# Patient Record
Sex: Male | Born: 1955 | Race: Black or African American | Hispanic: No | Marital: Married | Smoking: Former smoker
Health system: Southern US, Community
[De-identification: ages and names within clinical notes are randomized; demographics above are authoritative.]

## PROBLEM LIST (undated history)

## (undated) DIAGNOSIS — H209 Unspecified iridocyclitis: Secondary | ICD-10-CM

## (undated) DIAGNOSIS — I10 Essential (primary) hypertension: Secondary | ICD-10-CM

## (undated) DIAGNOSIS — R569 Unspecified convulsions: Secondary | ICD-10-CM

## (undated) DIAGNOSIS — E119 Type 2 diabetes mellitus without complications: Secondary | ICD-10-CM

## (undated) HISTORY — DX: Type 2 diabetes mellitus without complications: E11.9

---

## 2004-03-17 ENCOUNTER — Ambulatory Visit: Payer: Self-pay | Admitting: Psychiatry

## 2004-03-17 ENCOUNTER — Other Ambulatory Visit (HOSPITAL_COMMUNITY): Admission: RE | Admit: 2004-03-17 | Discharge: 2004-06-15 | Payer: Self-pay | Admitting: Psychiatry

## 2004-06-29 ENCOUNTER — Emergency Department: Payer: Self-pay | Admitting: Emergency Medicine

## 2005-09-20 ENCOUNTER — Emergency Department: Payer: Self-pay | Admitting: Internal Medicine

## 2009-09-03 ENCOUNTER — Emergency Department: Payer: Self-pay | Admitting: Emergency Medicine

## 2010-03-29 ENCOUNTER — Inpatient Hospital Stay: Payer: Self-pay | Admitting: Internal Medicine

## 2010-03-29 DIAGNOSIS — I059 Rheumatic mitral valve disease, unspecified: Secondary | ICD-10-CM

## 2010-07-10 ENCOUNTER — Inpatient Hospital Stay: Payer: Self-pay | Admitting: Specialist

## 2010-11-03 ENCOUNTER — Emergency Department: Payer: Self-pay | Admitting: Emergency Medicine

## 2011-03-12 ENCOUNTER — Emergency Department: Payer: Self-pay | Admitting: Emergency Medicine

## 2011-03-12 LAB — RAPID INFLUENZA A&B ANTIGENS

## 2011-04-09 ENCOUNTER — Emergency Department: Payer: Self-pay | Admitting: Emergency Medicine

## 2011-04-09 LAB — COMPREHENSIVE METABOLIC PANEL
Albumin: 3.8 g/dL (ref 3.4–5.0)
Alkaline Phosphatase: 60 U/L (ref 50–136)
BUN: 15 mg/dL (ref 7–18)
Calcium, Total: 8.7 mg/dL (ref 8.5–10.1)
Glucose: 162 mg/dL — ABNORMAL HIGH (ref 65–99)
Osmolality: 293 (ref 275–301)
Potassium: 3.8 mmol/L (ref 3.5–5.1)
SGOT(AST): 32 U/L (ref 15–37)
SGPT (ALT): 27 U/L
Sodium: 145 mmol/L (ref 136–145)
Total Protein: 7.7 g/dL (ref 6.4–8.2)

## 2011-04-09 LAB — CBC WITH DIFFERENTIAL/PLATELET
Basophil #: 0 10*3/uL (ref 0.0–0.1)
Eosinophil %: 0.2 %
Lymphocyte %: 19.8 %
MCH: 27.5 pg (ref 26.0–34.0)
MCHC: 33 g/dL (ref 32.0–36.0)
Neutrophil #: 5.8 10*3/uL (ref 1.4–6.5)
Neutrophil %: 74.1 %
Platelet: 229 10*3/uL (ref 150–440)
RBC: 5.18 10*6/uL (ref 4.40–5.90)
RDW: 14.9 % — ABNORMAL HIGH (ref 11.5–14.5)

## 2011-04-09 LAB — ETHANOL: Ethanol %: <0.003 % (ref 0.000–0.080)

## 2011-04-10 LAB — URINALYSIS, COMPLETE
Bilirubin,UR: NEGATIVE
Glucose,UR: 150 mg/dL (ref 0–75)
Ketone: NEGATIVE
Nitrite: NEGATIVE
Ph: 7 (ref 4.5–8.0)
RBC,UR: <1 /HPF (ref 0–5)
Squamous Epithelial: NONE SEEN

## 2011-04-10 LAB — DRUG SCREEN, URINE
Amphetamines, Ur Screen: NEGATIVE (ref ?–1000)
Cannabinoid 50 Ng, Ur ~~LOC~~: POSITIVE (ref ?–50)
MDMA (Ecstasy)Ur Screen: NEGATIVE (ref ?–500)
Methadone, Ur Screen: NEGATIVE (ref ?–300)
Opiate, Ur Screen: NEGATIVE (ref ?–300)
Phencyclidine (PCP) Ur S: NEGATIVE (ref ?–25)

## 2011-05-18 ENCOUNTER — Emergency Department: Payer: Self-pay | Admitting: *Deleted

## 2011-05-18 LAB — DRUG SCREEN, URINE
Amphetamines, Ur Screen: NEGATIVE (ref ?–1000)
Barbiturates, Ur Screen: NEGATIVE (ref ?–200)
Benzodiazepine, Ur Scrn: NEGATIVE (ref ?–200)
MDMA (Ecstasy)Ur Screen: NEGATIVE (ref ?–500)
Opiate, Ur Screen: NEGATIVE (ref ?–300)
Phencyclidine (PCP) Ur S: NEGATIVE (ref ?–25)
Tricyclic, Ur Screen: NEGATIVE (ref ?–1000)

## 2011-05-18 LAB — COMPREHENSIVE METABOLIC PANEL
Albumin: 4.1 g/dL (ref 3.4–5.0)
Alkaline Phosphatase: 77 U/L (ref 50–136)
Anion Gap: 12 (ref 7–16)
BUN: 12 mg/dL (ref 7–18)
Chloride: 107 mmol/L (ref 98–107)
Co2: 22 mmol/L (ref 21–32)
EGFR (African American): >60
EGFR (Non-African Amer.): >60
Glucose: 94 mg/dL (ref 65–99)
Potassium: 4.3 mmol/L (ref 3.5–5.1)
SGOT(AST): 30 U/L (ref 15–37)
SGPT (ALT): 22 U/L
Sodium: 141 mmol/L (ref 136–145)
Total Protein: 8.5 g/dL — ABNORMAL HIGH (ref 6.4–8.2)

## 2011-05-18 LAB — ACETAMINOPHEN LEVEL: Acetaminophen: <2 ug/mL

## 2011-05-18 LAB — ETHANOL
Ethanol %: <0.003 % (ref 0.000–0.080)
Ethanol: <3 mg/dL

## 2011-05-18 LAB — CBC
MCH: 27.1 pg (ref 26.0–34.0)
MCV: 83 fL (ref 80–100)
Platelet: 233 10*3/uL (ref 150–440)
RBC: 5.61 10*6/uL (ref 4.40–5.90)
RDW: 15.4 % — ABNORMAL HIGH (ref 11.5–14.5)
WBC: 5.2 10*3/uL (ref 3.8–10.6)

## 2011-05-18 LAB — SALICYLATE LEVEL: Salicylates, Serum: <1.7 mg/dL

## 2011-07-04 ENCOUNTER — Emergency Department: Payer: Self-pay | Admitting: *Deleted

## 2011-08-03 ENCOUNTER — Emergency Department: Payer: Self-pay | Admitting: *Deleted

## 2011-08-03 LAB — BASIC METABOLIC PANEL
BUN: 12 mg/dL (ref 7–18)
Calcium, Total: 8.8 mg/dL (ref 8.5–10.1)
Chloride: 110 mmol/L — ABNORMAL HIGH (ref 98–107)
Co2: 27 mmol/L (ref 21–32)
Creatinine: 1.21 mg/dL (ref 0.60–1.30)
EGFR (African American): >60
EGFR (Non-African Amer.): >60
Glucose: 114 mg/dL — ABNORMAL HIGH (ref 65–99)
Osmolality: 284 (ref 275–301)
Potassium: 3.8 mmol/L (ref 3.5–5.1)
Sodium: 142 mmol/L (ref 136–145)

## 2011-08-03 LAB — CBC
HGB: 13.7 g/dL (ref 13.0–18.0)
MCHC: 32.6 g/dL (ref 32.0–36.0)
MCV: 82 fL (ref 80–100)
Platelet: 222 10*3/uL (ref 150–440)
RBC: 5.13 10*6/uL (ref 4.40–5.90)
WBC: 3.3 10*3/uL — ABNORMAL LOW (ref 3.8–10.6)

## 2011-08-03 LAB — CK TOTAL AND CKMB (NOT AT ARMC)
CK, Total: 315 U/L — ABNORMAL HIGH (ref 35–232)
CK-MB: 3.4 ng/mL (ref 0.5–3.6)

## 2011-08-03 LAB — TROPONIN I: Troponin-I: <0.02 ng/mL

## 2011-11-18 ENCOUNTER — Emergency Department: Payer: Self-pay | Admitting: Unknown Physician Specialty

## 2011-11-18 LAB — COMPREHENSIVE METABOLIC PANEL
Alkaline Phosphatase: 90 U/L (ref 50–136)
Anion Gap: 7 (ref 7–16)
BUN: 14 mg/dL (ref 7–18)
Bilirubin,Total: 0.4 mg/dL (ref 0.2–1.0)
Calcium, Total: 9.1 mg/dL (ref 8.5–10.1)
Chloride: 109 mmol/L — ABNORMAL HIGH (ref 98–107)
Co2: 28 mmol/L (ref 21–32)
Creatinine: 1.53 mg/dL — ABNORMAL HIGH (ref 0.60–1.30)
EGFR (Non-African Amer.): 51 — ABNORMAL LOW
Osmolality: 287 (ref 275–301)
Potassium: 4.2 mmol/L (ref 3.5–5.1)
SGPT (ALT): 24 U/L (ref 12–78)
Sodium: 144 mmol/L (ref 136–145)
Total Protein: 7.6 g/dL (ref 6.4–8.2)

## 2011-11-18 LAB — DRUG SCREEN, URINE
Barbiturates, Ur Screen: NEGATIVE (ref ?–200)
Cannabinoid 50 Ng, Ur ~~LOC~~: NEGATIVE (ref ?–50)
Cocaine Metabolite,Ur ~~LOC~~: POSITIVE (ref ?–300)
MDMA (Ecstasy)Ur Screen: NEGATIVE (ref ?–500)
Methadone, Ur Screen: NEGATIVE (ref ?–300)
Opiate, Ur Screen: NEGATIVE (ref ?–300)
Phencyclidine (PCP) Ur S: NEGATIVE (ref ?–25)

## 2011-11-18 LAB — CBC WITH DIFFERENTIAL/PLATELET
Basophil %: 0.8 %
Eosinophil #: 0 10*3/uL (ref 0.0–0.7)
HCT: 45.9 % (ref 40.0–52.0)
HGB: 15.4 g/dL (ref 13.0–18.0)
Lymphocyte #: 1 10*3/uL (ref 1.0–3.6)
Lymphocyte %: 22.3 %
MCH: 27.5 pg (ref 26.0–34.0)
MCHC: 33.5 g/dL (ref 32.0–36.0)
MCV: 82 fL (ref 80–100)
Monocyte #: 0.2 x10 3/mm (ref 0.2–1.0)
Neutrophil #: 3.1 10*3/uL (ref 1.4–6.5)

## 2011-11-18 LAB — URINALYSIS, COMPLETE
Bacteria: NONE SEEN
Bilirubin,UR: NEGATIVE
Glucose,UR: NEGATIVE mg/dL (ref 0–75)
Ketone: NEGATIVE
Nitrite: NEGATIVE
Protein: NEGATIVE
RBC,UR: <1 /HPF (ref 0–5)
Specific Gravity: 1.017 (ref 1.003–1.030)
Squamous Epithelial: NONE SEEN
WBC UR: <1 /HPF (ref 0–5)

## 2011-11-18 LAB — ETHANOL
Ethanol %: <0.003 % (ref 0.000–0.080)
Ethanol: <3 mg/dL

## 2011-12-14 DIAGNOSIS — I10 Essential (primary) hypertension: Secondary | ICD-10-CM | POA: Diagnosis present

## 2011-12-14 DIAGNOSIS — E119 Type 2 diabetes mellitus without complications: Secondary | ICD-10-CM

## 2011-12-14 DIAGNOSIS — R569 Unspecified convulsions: Secondary | ICD-10-CM

## 2011-12-18 ENCOUNTER — Emergency Department: Payer: Self-pay | Admitting: Emergency Medicine

## 2011-12-18 LAB — CBC WITH DIFFERENTIAL/PLATELET
Basophil #: 0 10*3/uL (ref 0.0–0.1)
Basophil %: 0.5 %
Eosinophil #: 0 10*3/uL (ref 0.0–0.7)
Eosinophil %: 0.5 %
HGB: 14.3 g/dL (ref 13.0–18.0)
Lymphocyte %: 57.6 %
MCHC: 33.6 g/dL (ref 32.0–36.0)
Neutrophil %: 33.1 %
RBC: 5.19 10*6/uL (ref 4.40–5.90)
RDW: 14.1 % (ref 11.5–14.5)

## 2011-12-18 LAB — DRUG SCREEN, URINE
Amphetamines, Ur Screen: NEGATIVE (ref ?–1000)
Barbiturates, Ur Screen: NEGATIVE (ref ?–200)
Benzodiazepine, Ur Scrn: NEGATIVE (ref ?–200)
Cannabinoid 50 Ng, Ur ~~LOC~~: NEGATIVE (ref ?–50)
Cocaine Metabolite,Ur ~~LOC~~: POSITIVE (ref ?–300)
MDMA (Ecstasy)Ur Screen: NEGATIVE (ref ?–500)
Phencyclidine (PCP) Ur S: NEGATIVE (ref ?–25)

## 2011-12-18 LAB — URINALYSIS, COMPLETE
Bacteria: NONE SEEN
Bilirubin,UR: NEGATIVE
Glucose,UR: NEGATIVE mg/dL (ref 0–75)
Leukocyte Esterase: NEGATIVE
Nitrite: NEGATIVE
Ph: 6 (ref 4.5–8.0)
Protein: NEGATIVE
Specific Gravity: 1.014 (ref 1.003–1.030)
Squamous Epithelial: NONE SEEN
WBC UR: 1 /HPF (ref 0–5)

## 2011-12-18 LAB — BASIC METABOLIC PANEL
BUN: 18 mg/dL (ref 7–18)
Chloride: 108 mmol/L — ABNORMAL HIGH (ref 98–107)
Creatinine: 1.45 mg/dL — ABNORMAL HIGH (ref 0.60–1.30)
EGFR (African American): >60
EGFR (Non-African Amer.): 54 — ABNORMAL LOW
Glucose: 94 mg/dL (ref 65–99)
Potassium: 3.8 mmol/L (ref 3.5–5.1)
Sodium: 142 mmol/L (ref 136–145)

## 2011-12-19 ENCOUNTER — Emergency Department: Payer: Self-pay | Admitting: Emergency Medicine

## 2011-12-19 LAB — CBC WITH DIFFERENTIAL/PLATELET
Basophil #: 0 10*3/uL (ref 0.0–0.1)
Eosinophil #: 0 10*3/uL (ref 0.0–0.7)
Eosinophil %: 0.3 %
Lymphocyte #: 1.8 10*3/uL (ref 1.0–3.6)
MCH: 27.1 pg (ref 26.0–34.0)
MCHC: 33.2 g/dL (ref 32.0–36.0)
MCV: 82 fL (ref 80–100)
Neutrophil %: 51.2 %
Platelet: 253 10*3/uL (ref 150–440)
RDW: 14.3 % (ref 11.5–14.5)
WBC: 4.8 10*3/uL (ref 3.8–10.6)

## 2011-12-19 LAB — COMPREHENSIVE METABOLIC PANEL
Albumin: 3.8 g/dL (ref 3.4–5.0)
Chloride: 109 mmol/L — ABNORMAL HIGH (ref 98–107)
Creatinine: 1.52 mg/dL — ABNORMAL HIGH (ref 0.60–1.30)
EGFR (African American): 59 — ABNORMAL LOW
EGFR (Non-African Amer.): 51 — ABNORMAL LOW
Potassium: 3.7 mmol/L (ref 3.5–5.1)
SGOT(AST): 34 U/L (ref 15–37)
SGPT (ALT): 30 U/L (ref 12–78)
Total Protein: 7.8 g/dL (ref 6.4–8.2)

## 2011-12-19 LAB — ETHANOL: Ethanol: <3 mg/dL

## 2012-04-23 ENCOUNTER — Emergency Department: Payer: Self-pay | Admitting: Internal Medicine

## 2012-04-23 LAB — DRUG SCREEN, URINE
Amphetamines, Ur Screen: NEGATIVE (ref ?–1000)
Benzodiazepine, Ur Scrn: NEGATIVE (ref ?–200)
Cocaine Metabolite,Ur ~~LOC~~: POSITIVE (ref ?–300)
MDMA (Ecstasy)Ur Screen: NEGATIVE (ref ?–500)
Methadone, Ur Screen: NEGATIVE (ref ?–300)
Opiate, Ur Screen: NEGATIVE (ref ?–300)

## 2012-04-23 LAB — URINALYSIS, COMPLETE
Bacteria: NONE SEEN
Ketone: NEGATIVE
Nitrite: NEGATIVE
RBC,UR: 1 /HPF (ref 0–5)
WBC UR: <1 /HPF (ref 0–5)

## 2012-04-23 LAB — CBC
HCT: 41.4 % (ref 40.0–52.0)
MCH: 27.3 pg (ref 26.0–34.0)
RDW: 15.1 % — ABNORMAL HIGH (ref 11.5–14.5)

## 2012-04-23 LAB — COMPREHENSIVE METABOLIC PANEL
Bilirubin,Total: 0.6 mg/dL (ref 0.2–1.0)
Co2: 28 mmol/L (ref 21–32)
EGFR (Non-African Amer.): >60
Glucose: 100 mg/dL — ABNORMAL HIGH (ref 65–99)
SGOT(AST): 25 U/L (ref 15–37)
SGPT (ALT): 22 U/L (ref 12–78)
Total Protein: 7.7 g/dL (ref 6.4–8.2)

## 2012-11-15 ENCOUNTER — Inpatient Hospital Stay: Payer: Self-pay | Admitting: Student

## 2012-11-15 LAB — DRUG SCREEN, URINE
Amphetamines, Ur Screen: NEGATIVE (ref ?–1000)
Barbiturates, Ur Screen: NEGATIVE (ref ?–200)
Benzodiazepine, Ur Scrn: NEGATIVE (ref ?–200)
Cocaine Metabolite,Ur ~~LOC~~: POSITIVE (ref ?–300)
MDMA (Ecstasy)Ur Screen: NEGATIVE (ref ?–500)
Methadone, Ur Screen: NEGATIVE (ref ?–300)
Opiate, Ur Screen: NEGATIVE (ref ?–300)
Phencyclidine (PCP) Ur S: NEGATIVE (ref ?–25)
Tricyclic, Ur Screen: NEGATIVE (ref ?–1000)

## 2012-11-15 LAB — URINALYSIS, COMPLETE
Bacteria: NONE SEEN
Bilirubin,UR: NEGATIVE
Blood: NEGATIVE
Glucose,UR: NEGATIVE mg/dL (ref 0–75)
Nitrite: NEGATIVE
RBC,UR: NONE SEEN /HPF (ref 0–5)
Specific Gravity: 1.021 (ref 1.003–1.030)
Squamous Epithelial: NONE SEEN
WBC UR: 2 /HPF (ref 0–5)

## 2012-11-15 LAB — HEPATIC FUNCTION PANEL A (ARMC)
Albumin: 4.2 g/dL (ref 3.4–5.0)
Bilirubin, Direct: 0.2 mg/dL (ref 0.00–0.20)
Bilirubin,Total: 1 mg/dL (ref 0.2–1.0)
SGOT(AST): 37 U/L (ref 15–37)
SGPT (ALT): 26 U/L (ref 12–78)

## 2012-11-15 LAB — CBC WITH DIFFERENTIAL/PLATELET
Basophil #: 0 10*3/uL (ref 0.0–0.1)
Eosinophil %: 0 %
HGB: 16.6 g/dL (ref 13.0–18.0)
Lymphocyte #: 1 10*3/uL (ref 1.0–3.6)
Lymphocyte %: 12.8 %
MCH: 27.2 pg (ref 26.0–34.0)
MCV: 81 fL (ref 80–100)
Neutrophil #: 6.5 10*3/uL (ref 1.4–6.5)
Neutrophil %: 81.8 %
Platelet: 251 10*3/uL (ref 150–440)
RBC: 6.11 10*6/uL — ABNORMAL HIGH (ref 4.40–5.90)

## 2012-11-15 LAB — BASIC METABOLIC PANEL
Anion Gap: 5 — ABNORMAL LOW (ref 7–16)
BUN: 12 mg/dL (ref 7–18)
Calcium, Total: 9.6 mg/dL (ref 8.5–10.1)
Chloride: 104 mmol/L (ref 98–107)
Creatinine: 1.45 mg/dL — ABNORMAL HIGH (ref 0.60–1.30)
EGFR (African American): >60
EGFR (Non-African Amer.): 54 — ABNORMAL LOW
Glucose: 109 mg/dL — ABNORMAL HIGH (ref 65–99)
Sodium: 137 mmol/L (ref 136–145)

## 2012-11-16 LAB — CBC WITH DIFFERENTIAL/PLATELET
Basophil #: 0 10*3/uL (ref 0.0–0.1)
Eosinophil #: 0 10*3/uL (ref 0.0–0.7)
Eosinophil %: 0.2 %
HCT: 46.3 % (ref 40.0–52.0)
HGB: 15.7 g/dL (ref 13.0–18.0)
Lymphocyte #: 2.9 10*3/uL (ref 1.0–3.6)
MCH: 27.3 pg (ref 26.0–34.0)
Monocyte %: 8.7 %
Neutrophil %: 41.2 %
Platelet: 237 10*3/uL (ref 150–440)
RBC: 5.74 10*6/uL (ref 4.40–5.90)
RDW: 14.3 % (ref 11.5–14.5)
WBC: 5.9 10*3/uL (ref 3.8–10.6)

## 2012-11-16 LAB — MAGNESIUM: Magnesium: 2 mg/dL

## 2012-11-16 LAB — LIPID PANEL
Cholesterol: 172 mg/dL (ref 0–200)
HDL Cholesterol: 40 mg/dL (ref 40–60)
Ldl Cholesterol, Calc: 113 mg/dL — ABNORMAL HIGH (ref 0–100)
Triglycerides: 97 mg/dL (ref 0–200)
VLDL Cholesterol, Calc: 19 mg/dL (ref 5–40)

## 2012-11-16 LAB — BASIC METABOLIC PANEL
BUN: 13 mg/dL (ref 7–18)
Co2: 26 mmol/L (ref 21–32)
Glucose: 85 mg/dL (ref 65–99)
Osmolality: 277 (ref 275–301)
Potassium: 3.4 mmol/L — ABNORMAL LOW (ref 3.5–5.1)
Sodium: 139 mmol/L (ref 136–145)

## 2012-11-16 LAB — HEMOGLOBIN A1C: Hemoglobin A1C: 6.2 % (ref 4.2–6.3)

## 2012-11-17 LAB — BASIC METABOLIC PANEL
Anion Gap: 8 (ref 7–16)
Calcium, Total: 9.1 mg/dL (ref 8.5–10.1)
Co2: 24 mmol/L (ref 21–32)
EGFR (African American): >60
Osmolality: 279 (ref 275–301)

## 2012-11-17 LAB — HEMOGLOBIN A1C: Hemoglobin A1C: 6.2 % (ref 4.2–6.3)

## 2013-02-03 ENCOUNTER — Emergency Department: Payer: Self-pay | Admitting: Emergency Medicine

## 2013-02-03 LAB — RAPID INFLUENZA A&B ANTIGENS

## 2013-02-03 LAB — URINALYSIS, COMPLETE
Bilirubin,UR: NEGATIVE
Blood: NEGATIVE
Glucose,UR: NEGATIVE mg/dL (ref 0–75)
Leukocyte Esterase: NEGATIVE
Nitrite: NEGATIVE
Protein: 100
Squamous Epithelial: NONE SEEN
WBC UR: 3 /HPF (ref 0–5)

## 2013-02-03 LAB — DRUG SCREEN, URINE
Barbiturates, Ur Screen: NEGATIVE (ref ?–200)
Benzodiazepine, Ur Scrn: NEGATIVE (ref ?–200)
Cannabinoid 50 Ng, Ur ~~LOC~~: POSITIVE (ref ?–50)
Cocaine Metabolite,Ur ~~LOC~~: POSITIVE (ref ?–300)
MDMA (Ecstasy)Ur Screen: NEGATIVE (ref ?–500)
Tricyclic, Ur Screen: NEGATIVE (ref ?–1000)

## 2013-02-03 LAB — CBC
HCT: 46.6 % (ref 40.0–52.0)
HGB: 15.2 g/dL (ref 13.0–18.0)
MCHC: 32.5 g/dL (ref 32.0–36.0)
Platelet: 256 10*3/uL (ref 150–440)
RBC: 5.73 10*6/uL (ref 4.40–5.90)
RDW: 14.8 % — ABNORMAL HIGH (ref 11.5–14.5)
WBC: 7.5 10*3/uL (ref 3.8–10.6)

## 2013-02-03 LAB — BASIC METABOLIC PANEL
Anion Gap: 4 — ABNORMAL LOW (ref 7–16)
BUN: 15 mg/dL (ref 7–18)
Calcium, Total: 9.5 mg/dL (ref 8.5–10.1)
Chloride: 106 mmol/L (ref 98–107)
Co2: 27 mmol/L (ref 21–32)
EGFR (African American): >60
Osmolality: 275 (ref 275–301)

## 2013-02-03 LAB — ETHANOL
Ethanol %: <0.003 % (ref 0.000–0.080)
Ethanol: <3 mg/dL

## 2013-03-14 ENCOUNTER — Encounter: Payer: Self-pay | Admitting: Interventional Cardiology

## 2013-04-10 ENCOUNTER — Emergency Department: Payer: Self-pay | Admitting: Emergency Medicine

## 2013-05-03 ENCOUNTER — Other Ambulatory Visit (HOSPITAL_COMMUNITY): Payer: No Typology Code available for payment source | Attending: Psychiatry | Admitting: Psychology

## 2013-05-03 DIAGNOSIS — F1220 Cannabis dependence, uncomplicated: Secondary | ICD-10-CM | POA: Insufficient documentation

## 2013-05-03 DIAGNOSIS — G40909 Epilepsy, unspecified, not intractable, without status epilepticus: Secondary | ICD-10-CM | POA: Insufficient documentation

## 2013-05-03 DIAGNOSIS — F1420 Cocaine dependence, uncomplicated: Secondary | ICD-10-CM | POA: Insufficient documentation

## 2013-05-03 DIAGNOSIS — I10 Essential (primary) hypertension: Secondary | ICD-10-CM | POA: Insufficient documentation

## 2013-05-03 DIAGNOSIS — F4310 Post-traumatic stress disorder, unspecified: Secondary | ICD-10-CM | POA: Insufficient documentation

## 2013-05-03 DIAGNOSIS — F1021 Alcohol dependence, in remission: Secondary | ICD-10-CM | POA: Insufficient documentation

## 2013-05-04 NOTE — Progress Notes (Unsigned)
    Daily Group Progress Note  Program: CD-IOP   Group Time: 1:00-2:30pm  Participation Level: Active  Behavioral Response: Appropriate  Type of Therapy: Process Group  Topic: Group members checked in by sharing their names and sobriety dates.  Counselor led group in 5 minutes of HeartMath breathing in order to promote relaxation and focus.  Group members then discussed how they were doing and shared about issues related to early recovery, including managing volatile emotions and PAWS symptoms, grief and loss, and making friends with others in recovery.  Three new group members were present, and each one shared about their reasons for seeking treatment in the program.       Group Time: 2:45-4:00pm  Participation Level: Active  Behavioral Response: Appropriate  Type of Therapy: Psycho-education Group  Topic: Counselor led a session on Distress Tolerance.  She led an exercise to demonstrate distress tolerance, asking group members hold their arms in front of them for about 2 minutes and then having them share how they coped with the discomfort.  She reviewed distraction strategies (represented by the acronym ACCEPTS) and asked group members to provide examples of these strategies.  She also explained the concept of self-soothing and had group members brainstorm self-soothing methods for each of the five senses.  Counselor emphasized that these strategies were especially helpful for managing cravings in early recovery.   Summary: Patient reported a sobriety date of 3/18. This was his first group session and he shared about himself with the group.  He shared that he had attained some sobriety in the past, and relapsed on crack.  He reported that he had not drank since February of last year, but was struggling to stop using crack and marijuana.  He shared that he has a seizure disorder, possibly related to drug use, and was trying to take his medication consistently.  He became tearful in  response to another group member sharing about grief, and noted that he had lost his mother while he was using.  He stated, "I want to change."  Patient showed willingness to be open with the group and connect with others.   Family Program: Family present? No   Name of family member(s): n/a  UDS collected: Yes Results: not yet available  AA/NA attended?: No  Sponsor?: No   [facility]

## 2013-05-05 ENCOUNTER — Encounter (HOSPITAL_COMMUNITY): Payer: Self-pay | Admitting: Psychology

## 2013-05-05 ENCOUNTER — Other Ambulatory Visit (HOSPITAL_COMMUNITY): Payer: No Typology Code available for payment source

## 2013-05-05 DIAGNOSIS — F1420 Cocaine dependence, uncomplicated: Secondary | ICD-10-CM

## 2013-05-05 DIAGNOSIS — F1220 Cannabis dependence, uncomplicated: Secondary | ICD-10-CM | POA: Insufficient documentation

## 2013-05-05 HISTORY — DX: Cocaine dependence, uncomplicated: F14.20

## 2013-05-05 NOTE — Progress Notes (Unsigned)
Patient ID: Carlos LeavensGeorge Gayheart, male   DOB: 1955-09-30, 58 y.o.   MRN: [id number]   PHONE CALL  PT CALLED AT HOME PHONE AND CONFIDENTIAL MESSAGE LEFT REGARDING REGRETS OVER CANCELLATION OF GROUP AND INVITATION TO CALL BACK IF HE WANTS/NEEDS TO

## 2013-05-08 ENCOUNTER — Other Ambulatory Visit (HOSPITAL_COMMUNITY): Payer: No Typology Code available for payment source | Admitting: Psychology

## 2013-05-08 DIAGNOSIS — F1420 Cocaine dependence, uncomplicated: Secondary | ICD-10-CM

## 2013-05-08 DIAGNOSIS — F1220 Cannabis dependence, uncomplicated: Secondary | ICD-10-CM

## 2013-05-09 ENCOUNTER — Encounter (HOSPITAL_COMMUNITY): Payer: Self-pay | Admitting: Psychology
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
## 2013-05-10 ENCOUNTER — Other Ambulatory Visit (HOSPITAL_COMMUNITY): Payer: No Typology Code available for payment source | Admitting: Psychology

## 2013-05-10 DIAGNOSIS — F1420 Cocaine dependence, uncomplicated: Secondary | ICD-10-CM

## 2013-05-10 DIAGNOSIS — F1220 Cannabis dependence, uncomplicated: Secondary | ICD-10-CM

## 2013-05-11 NOTE — Progress Notes (Unsigned)
    Daily Group Progress Note  Program: CD-IOP   Group Time: 1:00-2:30pm  Participation Level: Active  Behavioral Response: Appropriate  Type of Therapy: Process Group  Topic: The first half of group was spent in process.  Counselor opened the group with a check-in and then led 5 minutes of a relaxation breathing exercise including some calming music.  Counselor then led a review on the group expectations and beliefs, and group members discussed concerns and suggestions about these expectations. Much of the conversation centered around the therapeutic benefits of sharing feelings and attending to others while they share. Group members then shared about issues they experienced during early recovery, including dealing with family health issues, handling denial, and finding a recovery community that meets their needs.     Group Time: 2:45-4:00pm  Participation Level: Active  Behavioral Response: Appropriate  Type of Therapy: Psycho-education Group  Topic: Counselor led a discussion on the Serenity Prayer.   Group members spent time creating lists of things that they can and cannot change, and then shared their lists with the entire group.  Counselor emphasized that in recovery, addicts have the ability to change their own behaviors, attitudes, and outlook, and cannot change other people, the past, or unforeseen events.  Counselor asked clients to share one thing they could change that day in order to better support their recovery, and several group members shared ideas.   Summary: Patient reported a sobriety date of 3/21.  He shared that he had attended two NA meetings since the last group session, and had enjoyed the experience.  He reported that he got phone numbers from other people in recovery and was looking forward to attending more meetings.  During the process group, he offered encouragement and feedback to several other group members.  Patient appears to be more hopeful and more  invested in treatment compared to previous sessions.   Family Program: Family present? No   Name of family member(s): n/a  UDS collected: Yes Results: not yet available  AA/NA attended?: YesTuesday and Wednesday  Sponsor?: No   [facility]

## 2013-05-12 ENCOUNTER — Other Ambulatory Visit (HOSPITAL_COMMUNITY): Payer: No Typology Code available for payment source | Admitting: Psychology

## 2013-05-12 DIAGNOSIS — F1020 Alcohol dependence, uncomplicated: Secondary | ICD-10-CM | POA: Insufficient documentation

## 2013-05-12 HISTORY — DX: Alcohol dependence, uncomplicated: F10.20
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
## 2013-05-14 ENCOUNTER — Encounter (HOSPITAL_COMMUNITY): Payer: Self-pay

## 2013-05-15 ENCOUNTER — Other Ambulatory Visit (HOSPITAL_COMMUNITY): Payer: No Typology Code available for payment source

## 2013-05-15 ENCOUNTER — Encounter (HOSPITAL_COMMUNITY): Payer: Self-pay | Admitting: Psychology

## 2013-05-15 NOTE — Progress Notes (Signed)
    Daily Group Progress Note  Program: CD-IOP   Group Time: 1-2:30 pm  Participation Level: Minimal, he was meeting with the program director  Behavioral Response: Sharing  Type of Therapy: Process Group  Topic: Group Process; the first part of group was spent in process. After check-in, a member made a heartfelt and painful confession that he had lied to the group during the last session. He had relapsed, but been so embarrassed and ashamed that he couldn't admit it on Wednesday. The group provided positive and validating feedback and emphasized the good news, which was that he had "come back". A new member was also present and he introduced himself. He had also relapsed after 10+ years of sobriety and he received a warm welcome from his new group members. During this session the program director was meeting with new group members and current ones regarding medications and any issues or concerns about their prescriptions.   Group Time: 2:45-4pm  Participation Level: Active  Behavioral Response: Sharing  Type of Therapy: Psycho-education Group  Topic: Psycho Ed; the second part of group was spent in an educational presentation. The group was asked to identify the pros and cons of sobriety versus active addiction. Members were quick to identify the pros or positives of active addiction and the cons. They were able to easily identify the pros of sobriety, but had a tough time identifying the cons of sobriety. There was good interaction and feedback among members and it proved insightful and provided a bonding or experience of connection among the group.   Summary: The patient met with the program director and seemed pleased with his session. He reported he had attended an NA meeting on Wednesday evening, but watched the b-ball game last night. He made some comments that suggested he is starting to understand the chronic nature of recovery and that he is going to have to do this for the rest  of his life. His sobriety date remains 3/21.   Family Program: Family present? No   Name of family member(s):   UDS collected: No Results:  AA/NA attended?: YesWednesday  Sponsor?: No   Dewanna Hurston, LCAS

## 2013-05-17 ENCOUNTER — Other Ambulatory Visit (HOSPITAL_COMMUNITY): Payer: No Typology Code available for payment source | Attending: Psychiatry | Admitting: Psychology

## 2013-05-17 DIAGNOSIS — I10 Essential (primary) hypertension: Secondary | ICD-10-CM | POA: Diagnosis not present

## 2013-05-17 DIAGNOSIS — F1420 Cocaine dependence, uncomplicated: Secondary | ICD-10-CM | POA: Diagnosis not present

## 2013-05-17 DIAGNOSIS — G40909 Epilepsy, unspecified, not intractable, without status epilepticus: Secondary | ICD-10-CM | POA: Diagnosis not present

## 2013-05-17 DIAGNOSIS — F1021 Alcohol dependence, in remission: Secondary | ICD-10-CM | POA: Diagnosis not present

## 2013-05-17 DIAGNOSIS — F1220 Cannabis dependence, uncomplicated: Secondary | ICD-10-CM | POA: Diagnosis not present

## 2013-05-17 DIAGNOSIS — F4310 Post-traumatic stress disorder, unspecified: Secondary | ICD-10-CM | POA: Diagnosis not present

## 2013-05-17 DIAGNOSIS — F1020 Alcohol dependence, uncomplicated: Secondary | ICD-10-CM

## 2013-05-18 NOTE — Progress Notes (Signed)
    Daily Group Progress Note  Program: CD-IOP   Group Time: 1:00-2:30pm  Participation Level: Active  Behavioral Response: Appropriate  Type of Therapy: Process Group  Topic: Group members checked in by sharing their names and sobriety dates.  Counselor led the group in 5 minutes of HeartMath relaxation.  Group members then shared and discussed issues related to early recovery, including managing stress, missing the "old life," and the importance of identifying and expressing feelings.       Group Time: 2:45-4:00pm  Participation Level: Active  Behavioral Response: Appropriate  Type of Therapy: Psycho-education Group  Topic: Counselor led a discussion on assertiveness.  Group members reviewed a handout outlining the differences between passiveness, aggressiveness, and assertiveness.  They discussed their typical patterns of communication, the origins of those patterns, and the drawbacks to being either passive or aggressive.  Counselor then led 4 roleplay exercises in which group members acted out both their typical communication style and practiced assertiveness skills.   Summary: Patient's sobriety date remains 3/21. During the process group, he connected with several other group members about the issues they discussed, including handling stress and missing the excitement of active addiction.  He was quiet but attentive during the psychoeducation group.   Family Program: Family present? No   Name of family member(s): n/a  UDS collected: No Results: n/a  AA/NA attended?: YesTuesday  Sponsor?: No   [facility]

## 2013-05-19 ENCOUNTER — Other Ambulatory Visit (HOSPITAL_COMMUNITY): Payer: No Typology Code available for payment source

## 2013-05-22 ENCOUNTER — Other Ambulatory Visit (HOSPITAL_COMMUNITY): Payer: No Typology Code available for payment source | Admitting: Psychology

## 2013-05-22 DIAGNOSIS — F1420 Cocaine dependence, uncomplicated: Secondary | ICD-10-CM | POA: Diagnosis not present

## 2013-05-23 ENCOUNTER — Encounter (HOSPITAL_COMMUNITY): Payer: Self-pay | Admitting: Psychology

## 2013-05-23 NOTE — Progress Notes (Signed)
    Daily Group Progress Note  Program: CD-IOP   Group Time: 12-2:30 pm  Participation Level: Active  Behavioral Response: Sharing  Type of Therapy: Process Group  Topic: Group Process: the first part of group was spent in process. The group was much smaller with a number of members still out from the holiday weekend. Members shared about the past weekend and any concerns or issues that may have appeared. At my urging, one member disclosed and inappropriate relationship with a fellow group members. It had only begun early lat week and consisted of talking and texting. Despite just a few days, it accelerated quickly and spun out of control, when the member's husband found the text messages. The patient explained his part of this brief disaster and expressed his remorse and apologized to his fellow group members. It was an intense session with members very attentive, but non-judgmental of their fellow group members. The member was applauded for his disclosures and a good discussion ensued about handling 'addictive' thoughts and its accompanying mentality.  Group Time: 2:45- 4pm  Participation Level: Minimal  Behavioral Response: Sharing  Type of Therapy: Psycho-education Group  Topic: Taking a Daily Inventory. The second half of group was spent in a psycho-ed. Members were provided a handout with a list of personality characteristics common of active addiction and self-will run wild. This list of characteristics was contrasted with a list of those more typical of sobriety and characteristics of one's Higher Power. Members discussed the characteristics and identified those most common when they are drinking or drugging. This proved particularly fitting considering the first part of group spent discussing self-will gone wild. The session was very open and members very honest about themselves. Emphasized in this session was the importance of identifying or picking up on this type of thinking and,  most important, talking to someone and addressing them when they first appear.    Summary: The patient reported he had been feeling badly all weekend. He wasn't sure if he had a cold or it was allergies. He reminded the group that he had to work on Thursday and he was unable to come to group. The patient reported he had worked all day Thursday, Friday and Saturday, but admitted he had not attended any meetings since last Wednesday. I reminded this member that attending meetings is required and he needed to get to 3 meetings between today and the next group session. He was reminded that if he could work he could certainly sit through an Engineer, technical saleshour-long meeting. The patient he was having crazy dreams and I reminded the group that when people stop smoking cannabis, they typically experience bizarre and colorful dreams. In the second half of group, the patient reported his most typical characteristics are anger and boredom. He did mention that although he has kept drug-free, he is noticing his wife is making comments about crack. The patient reported he is intent on staying clean and although his wife used with him, he has no intention of buying anything for her. He made some good comments, but his failure to attend enough 12-step meetings will be an obstacle to him remaining in our program.    Family Program: Family present? No   Name of family member(s):   UDS collected: No Results:   AA/NA attended?: No  Sponsor?: No   Jiali Linney, LCAS

## 2013-05-24 ENCOUNTER — Other Ambulatory Visit (HOSPITAL_COMMUNITY): Payer: No Typology Code available for payment source | Admitting: Psychology

## 2013-05-24 DIAGNOSIS — F1420 Cocaine dependence, uncomplicated: Secondary | ICD-10-CM

## 2013-05-24 DIAGNOSIS — F1020 Alcohol dependence, uncomplicated: Secondary | ICD-10-CM

## 2013-05-24 DIAGNOSIS — F1220 Cannabis dependence, uncomplicated: Secondary | ICD-10-CM

## 2013-05-25 NOTE — Progress Notes (Unsigned)
    Daily Group Progress Note  Program: CD-IOP   Group Time: 1:00-2:30pm  Participation Level: Active  Behavioral Response: Appropriate  Type of Therapy: Process Group  Topic: The first hour of group was spent in process.  Counselor led a check-in and five minutes of relaxation.  Group members shared about their progress and issues they were experiencing in early recovery, including managing symptoms of PAWS.  After a presentation by a hospital Chaplain, the third hour of group was also spent in process.  A new group member shared about herself and received encouragement and feedback from other members.     Group Time: 2:45-4:00pm  Participation Level: Active  Behavioral Response: Appropriate  Type of Therapy: Psycho-education Group  Topic: The second hour of group was spent with Chaplain Ashley MarinerMatt Stalnaker, who led a discussion on the topic of forgiveness.  Group members shared their beliefs and ideas about forgiveness and discussed the need to forgive others and themselves. They also shared about the difficulties they have with forgiveness, particularly with forgiving themselves for their actions during active addiction.   Summary: Patient's sobriety date remains 3/21.  During the process group, he asked about clarification on the symptoms of PAWS.  During the discussion on forgiveness, he became somewhat tearful as he shared that he regrets not being more present during his daughter's teenage years.  He noted several of the consequences she experienced due to his drug use.  Group members encouraged him to work on forgiving himself for his behavior during active addiction.   Family Program: Family present? No   Name of family member(s): n/a  UDS collected: Yes Results: not yet available  AA/NA attended?: YesTuesday  Sponsor?: No   [facility]

## 2013-05-26 ENCOUNTER — Other Ambulatory Visit (HOSPITAL_COMMUNITY): Payer: No Typology Code available for payment source | Admitting: Psychology

## 2013-05-26 DIAGNOSIS — F1420 Cocaine dependence, uncomplicated: Secondary | ICD-10-CM | POA: Diagnosis not present

## 2013-05-28 ENCOUNTER — Encounter (HOSPITAL_COMMUNITY): Payer: Self-pay | Admitting: Psychology

## 2013-05-28 NOTE — Progress Notes (Signed)
    Daily Group Progress Note  Program: CD-IOP   Group Time: 1-2:30 pm  Participation Level: Minimal  Behavioral Response: Sharing  Type of Therapy: Psycho-education Group  Topic: Psycho-Ed: after completing the check-in, the first part of group was spent in a psycho-ed that included a review of PAWS and a review of the relapse process. A new group member was present and he was open about his struggles. He had only been sober for less than 2 weeks and he received excellent feedback and support from his fellow group members. The session proved informative for new members and a good review for current members. During this time, the program director, CK, met with 2 new group members and members discharging or wanting to discuss medications.  Group Time: 2:45- 4pm  Participation Level: Minimal  Behavioral Response: Sharing  Type of Therapy: Process Group  Topic: Process/Graduation: the second half of group included process, where members shared about current issues and concerns. New struggles or challenges were also shared by the newer group members. As the session came to an end, a graduation ceremony was held to honor a member who was moving on after successfully completing the program. There were kind words of thanks and hope shared by all and it proved to be a touching ending to the session and the week.   Summary: The patient reported he had attended an NA meeting last night. During the presentation on the relapse process, he asked about where he lives and if he should move? He lives in his childhood home and despite the triggers and obvious patterns of using that have been established, it seemed unlikely that he would move. The patient could relate to the difficulty and challenges of having cash and how he struggles with his crack cocaine addiction with any money in his pocket. The patient reported his sobriety date remains 3/21.  We will continue to follow closely in the days ahead.    Family Program: Family present? No   Name of family member(s):   UDS collected: No Results:  AA/NA attended?: YesThursday  Sponsor?: No   Carlos Contreras, LCAS

## 2013-05-29 ENCOUNTER — Other Ambulatory Visit (HOSPITAL_COMMUNITY): Payer: No Typology Code available for payment source

## 2013-05-29 ENCOUNTER — Telehealth (HOSPITAL_COMMUNITY): Payer: Self-pay | Admitting: Psychology

## 2013-05-29 ENCOUNTER — Encounter (HOSPITAL_COMMUNITY): Payer: Self-pay | Admitting: Psychology

## 2013-05-31 ENCOUNTER — Other Ambulatory Visit (HOSPITAL_COMMUNITY): Payer: No Typology Code available for payment source

## 2013-06-02 ENCOUNTER — Encounter (HOSPITAL_COMMUNITY): Payer: Self-pay | Admitting: Psychology

## 2013-06-02 ENCOUNTER — Other Ambulatory Visit (HOSPITAL_COMMUNITY): Payer: No Typology Code available for payment source

## 2013-06-02 NOTE — Progress Notes (Unsigned)
Carlos LeavensGeorge Traughber is a 58 y.o. male patient. Patient did not return to the program and is discharged from the CD-IOP. Please see discharge summary in Epic.         Valton Schwartz, LCAS

## 2013-06-05 ENCOUNTER — Other Ambulatory Visit (HOSPITAL_COMMUNITY): Payer: No Typology Code available for payment source

## 2013-06-07 ENCOUNTER — Other Ambulatory Visit (HOSPITAL_COMMUNITY): Payer: No Typology Code available for payment source

## 2013-06-09 ENCOUNTER — Other Ambulatory Visit (HOSPITAL_COMMUNITY): Payer: No Typology Code available for payment source

## 2013-06-12 ENCOUNTER — Other Ambulatory Visit (HOSPITAL_COMMUNITY): Payer: No Typology Code available for payment source

## 2013-06-14 ENCOUNTER — Other Ambulatory Visit (HOSPITAL_COMMUNITY): Payer: No Typology Code available for payment source

## 2013-06-16 ENCOUNTER — Other Ambulatory Visit (HOSPITAL_COMMUNITY): Payer: No Typology Code available for payment source

## 2013-06-19 ENCOUNTER — Other Ambulatory Visit (HOSPITAL_COMMUNITY): Payer: No Typology Code available for payment source

## 2013-06-21 ENCOUNTER — Other Ambulatory Visit (HOSPITAL_COMMUNITY): Payer: No Typology Code available for payment source

## 2013-06-23 ENCOUNTER — Other Ambulatory Visit (HOSPITAL_COMMUNITY): Payer: No Typology Code available for payment source

## 2013-06-26 ENCOUNTER — Other Ambulatory Visit (HOSPITAL_COMMUNITY): Payer: No Typology Code available for payment source

## 2013-06-28 ENCOUNTER — Other Ambulatory Visit (HOSPITAL_COMMUNITY): Payer: No Typology Code available for payment source

## 2013-06-30 ENCOUNTER — Other Ambulatory Visit (HOSPITAL_COMMUNITY): Payer: No Typology Code available for payment source

## 2013-07-03 ENCOUNTER — Other Ambulatory Visit (HOSPITAL_COMMUNITY): Payer: No Typology Code available for payment source

## 2013-07-05 ENCOUNTER — Other Ambulatory Visit (HOSPITAL_COMMUNITY): Payer: No Typology Code available for payment source

## 2013-07-07 ENCOUNTER — Other Ambulatory Visit (HOSPITAL_COMMUNITY): Payer: No Typology Code available for payment source

## 2013-07-12 ENCOUNTER — Other Ambulatory Visit (HOSPITAL_COMMUNITY): Payer: No Typology Code available for payment source

## 2013-07-14 ENCOUNTER — Other Ambulatory Visit (HOSPITAL_COMMUNITY): Payer: No Typology Code available for payment source

## 2013-07-17 ENCOUNTER — Other Ambulatory Visit (HOSPITAL_COMMUNITY): Payer: No Typology Code available for payment source

## 2013-07-19 ENCOUNTER — Other Ambulatory Visit (HOSPITAL_COMMUNITY): Payer: No Typology Code available for payment source

## 2013-07-21 ENCOUNTER — Other Ambulatory Visit (HOSPITAL_COMMUNITY): Payer: No Typology Code available for payment source

## 2013-07-24 ENCOUNTER — Other Ambulatory Visit (HOSPITAL_COMMUNITY): Payer: No Typology Code available for payment source

## 2013-07-26 ENCOUNTER — Other Ambulatory Visit (HOSPITAL_COMMUNITY): Payer: No Typology Code available for payment source

## 2013-07-28 ENCOUNTER — Other Ambulatory Visit (HOSPITAL_COMMUNITY): Payer: No Typology Code available for payment source

## 2013-07-31 ENCOUNTER — Other Ambulatory Visit (HOSPITAL_COMMUNITY): Payer: No Typology Code available for payment source

## 2013-08-02 ENCOUNTER — Other Ambulatory Visit (HOSPITAL_COMMUNITY): Payer: No Typology Code available for payment source

## 2013-11-26 ENCOUNTER — Encounter (HOSPITAL_COMMUNITY): Payer: Self-pay

## 2014-02-21 DIAGNOSIS — K219 Gastro-esophageal reflux disease without esophagitis: Secondary | ICD-10-CM | POA: Diagnosis present

## 2014-04-17 DEATH — deceased

## 2014-06-07 ENCOUNTER — Emergency Department
Admit: 2014-06-07 | Disposition: A | Discharge status: Home / Self Care | Payer: Self-pay | Admitting: Emergency Medicine

## 2014-06-07 LAB — URINALYSIS, COMPLETE
BILIRUBIN, UR: NEGATIVE
BLOOD: NEGATIVE
Bacteria: NONE SEEN
Glucose,UR: NEGATIVE mg/dL (ref 0–75)
KETONE: NEGATIVE
LEUKOCYTE ESTERASE: NEGATIVE
Nitrite: NEGATIVE
PH: 7 (ref 4.5–8.0)
PROTEIN: NEGATIVE
SPECIFIC GRAVITY: 1.013 (ref 1.003–1.030)
Squamous Epithelial: NONE SEEN

## 2014-06-07 LAB — COMPREHENSIVE METABOLIC PANEL
ANION GAP: 5 — AB (ref 7–16)
AST: 33 U/L
Albumin: 4.2 g/dL
Alkaline Phosphatase: 75 U/L
BUN: 14 mg/dL
Bilirubin,Total: 0.7 mg/dL
Calcium, Total: 9.4 mg/dL
Chloride: 110 mmol/L
Co2: 28 mmol/L
Creatinine: 1.34 mg/dL — ABNORMAL HIGH
EGFR (African American): >60
EGFR (Non-African Amer.): 59 — ABNORMAL LOW
GLUCOSE: 82 mg/dL
POTASSIUM: 4.5 mmol/L
SGPT (ALT): 23 U/L
SODIUM: 143 mmol/L
Total Protein: 7.7 g/dL

## 2014-06-07 LAB — CBC
HCT: 45.8 % (ref 40.0–52.0)
HGB: 14.7 g/dL (ref 13.0–18.0)
MCH: 26.8 pg (ref 26.0–34.0)
MCHC: 32.2 g/dL (ref 32.0–36.0)
MCV: 83 fL (ref 80–100)
Platelet: 242 10*3/uL (ref 150–440)
RBC: 5.5 10*6/uL (ref 4.40–5.90)
RDW: 14.7 % — AB (ref 11.5–14.5)
WBC: 7.8 10*3/uL (ref 3.8–10.6)

## 2014-06-07 LAB — PHENYTOIN LEVEL, TOTAL

## 2014-06-07 LAB — DRUG SCREEN, URINE
Amphetamines, Ur Screen: NEGATIVE
BARBITURATES, UR SCREEN: NEGATIVE
Benzodiazepine, Ur Scrn: NEGATIVE
COCAINE METABOLITE, UR ~~LOC~~: POSITIVE
Cannabinoid 50 Ng, Ur ~~LOC~~: POSITIVE
MDMA (Ecstasy)Ur Screen: NEGATIVE
Methadone, Ur Screen: NEGATIVE
OPIATE, UR SCREEN: NEGATIVE
Phencyclidine (PCP) Ur S: NEGATIVE
TRICYCLIC, UR SCREEN: NEGATIVE

## 2014-06-07 LAB — TROPONIN I

## 2014-06-07 LAB — ETHANOL

## 2014-06-08 NOTE — Consult Note (Signed)
PATIENT NAME:  Carlos Contreras, Carlos Contreras MR#:  [id number] DATE OF BIRTH:  1955-05-24  DATE OF CONSULTATION:  11/16/2012  CONSULTING PHYSICIAN:  Audery Amel, MD  IDENTIFYING INFORMATION AND REASON FOR CONSULT: A 59 year old gentleman with a history of alcohol and cocaine dependence who presented to the hospital confused and postictal. Consultation for evaluation of delirium and substance abuse.   HISTORY OF PRESENT ILLNESS: Information obtained from the patient and the chart. I attempted to reach his wife by telephone. I left a phone message on what I believe to be her cell phone but was not able to speak to anyone. The patient presented to the hospital and was described at that time yesterday as being confused and disoriented and unable to give a clear history.   It was reported that he had had a seizure. Somewhere the information was obtained that he had stopped using alcohol and cocaine 3 days previously, although I am not clear where that came from. The patient to me today tells me that he is aware that he had a seizure. He remembers being awoken by his wife telling him that he had just had a seizure and insisting that they go to the hospital. The patient does not know of any seizures prior to that since about February of this year. He tells me that he stopped drinking alcohol in February because he wanted to stop having seizures.   He has continued to use cocaine and he estimates his usage to be about once a week smoking "crack" cocaine. He also uses marijuana frequently. He has high blood pressure and has not been treating his blood pressure or taking his medicine. He has not had any recent head trauma.   The patient does not report any symptoms of depression, psychosis or confusion right now. He tells me that for the most part his mood is pretty good at home most of the time. His sleep is interrupted frequently but he denies any hallucinations, delusions, paranoia, or auditory or visual hallucinations or  suicidal or homicidal ideation.   PAST PSYCHIATRIC HISTORY: Longstanding history of alcohol and drug abuse going back decades. He says that many years ago, he and his wife tried to participate in some outpatient substance abuse counseling. He says he thought that it would be helpful, but they did not really follow through with it. He claims that he stopped drinking alcohol in February and did it without anyone's help. He has not engaged in any other substance abuse treatment. He has no other history of psychiatric illness or hospitalization. No history of suicidal behavior or violence.   PAST MEDICAL HISTORY: The patient has hypertension and has not been taking any of his medicine. He has had seizures several times before and presented to the hospital in a similar way as this time. On his prior admissions, it has been judged that they were alcohol withdrawal seizures. It does not look like he has been treated with anticonvulsants in the past.   SOCIAL HISTORY: The patient does not work. He lives with his wife and 64 year old daughter. Wife works. 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 He expresses some amazement that his wife has continued to stick with him all these years.   CURRENT MEDICATIONS: At home, he is not taking anything.   REVIEW OF SYSTEMS: Currently he denies any pain. He denies any nausea. Has not been vomiting. He is not feeling dizzy or confused. His mood is not feeling depressed. He does not  report any hallucinations. Denies any suicidal or homicidal ideation.   MENTAL STATUS: A middle-aged gentleman looks his stated age. He was awake and cooperative with the interview. Made good eye contact. Psychomotor activity was appropriate and normal. No sign of tremor. Speech was normal in rate, tone and volume. Affect was slightly constricted but not to a pathological degree. Mood was stated as being okay. Thoughts seem to be lucid without any sign of loosening of associations or  delusions. No bizarre statements. Denies hallucinations. Denies any suicidal or homicidal ideation. He was completely oriented to his place, time, situation and recent events. Did not show any sign of confusion or waxing or  waning mental state. Baseline intelligence normal. Recent judgment impaired insofar as using cocaine. Insight appears to be adequate. He admits that he has a substance abuse problem.   LABORATORY RESULTS: CBC was essentially normal. His admission chemistry panel showed normal liver function tests. Magnesium was normal. Alcohol not detected. His creatinine is slightly high at 1.45. His potassium is a little low at 3.4. His drug screen is positive for cannabis and cocaine.   ASSESSMENT: A 59 year old gentleman who presented confused but has now cleared up. He is currently not delirious. He is not having delirium tremens therefore. Presumably the delirium today was postictal. He had a seizure and has had multiple seizures in the past. Previously, they had been related to alcohol. He is claiming that he has not been drinking anytime recently but admits to using crack cocaine. His wife has not been available to confirm or deny his substance use pattern. His chemistry panel might tend to confirm his claim that he has not been drinking recently. If that is the case, then it is good that he has stopped drinking, but he still ought to stop using cocaine. The decision about whether he needs anticonvulsants is for neurology.   TREATMENT PLAN: Spent some time doing psychoeducation and supportive therapy with him, pointing out that cocaine can trigger seizures as well as increasing his blood pressure which can result in further kidney damage and elevated risk of stroke and heart disease. Strongly encouraged him to consider involving himself in substance abuse treatment. He has no interest in going to inpatient treatment. He does say he would be willing to consider going to some outpatient substance  abuse treatment.   I will suggest that an appointment be made for him or at least a referral be made to [facility] at discharge for substance abuse treatment. No other psychiatric treatment necessary right now.   DIAGNOSIS, PRINCIPAL AND PRIMARY:  AXIS I: Cocaine dependence.   SECONDARY DIAGNOSES:  AXIS I:   1.  Alcohol dependence, in early remission.   2.  Delirium, probably postictal, possibly substance induced, now resolved.  AXIS II: No diagnosis.  AXIS III: Hypertension, recent seizure.  AXIS IV: Moderate from chronic impairment and financial stress and lack of insurance.  AXIS V: Functioning at time of evaluation, 52.   ____________________________ Audery AmelJohn T. Jeraldean Wechter, MD jtc:np D: 11/16/2012 21:42:49 ET T: 11/16/2012 22:27:08 ET JOB#: [id number]  cc: Audery AmelJohn T. Jaeden Messer, MD, <Dictator> Audery AmelJOHN T Yariel Ferraris MD ELECTRONICALLY SIGNED 11/17/2012 0:05

## 2014-06-08 NOTE — Discharge Summary (Signed)
PATIENT NAME:  Carlos Contreras, Partick E MR#:  [id number] DATE OF BIRTH:  January 29, 1956  DATE OF ADMISSION:  11/15/2012 DATE OF DISCHARGE: 11/18/2012   CONSULTANTS: Dr. Toni Amendlapacs from psychiatry and Dr. Malvin JohnsPotter from neurology.   PR[facility]ICIAN[facility]   CHIEF COMPLAINT: Came in after a seizure.   DISCHARGE DIAGNOSES:  1.  Seizure, possibly from cocaine abuse.  2.  History of alcohol abuse in the past.  3.  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  4.  Hypokalemia.  5.  Hypertension.  6.  Diabetes, which is diet controlled.  7.  Chronic kidney disease.   DISCHARGE MEDICATIONS: Lisinopril 10 mg daily and phenytoin 100 mg extended-release 1 cap 3 times a day.   DIET: Low sodium.   ACTIVITY: As tolerated.   DISCHARGE INSTRUCTIONS: Please follow with PCP for blood pressure and kidney function. Check within 1 to 2 weeks. Please follow with neurology within 2 to 4 weeks.   DISPOSITION: Home.   CODE STATUS: Full code.  SIGNIFICANT LABS AND IMAGING: Initial BUN 12, creatinine 1.45, sodium 137, potassium 3.4. Last creatinine of 1.35, sodium 139. Alcohol level below detection. Initial LFTs within normal limits. Initial TSH 0.573. U-tox positive for cocaine and cannabinoids on arrival. WBC 8, hemoglobin 16.6, platelets 251. Initial UA did not suggest infection. CT of the head without contrast: No acute abnormality of the brain. No acute skull fracture. MRI of the brain did not show any acute intracranial pathology.  HISTORY OF PRESENT ILLNESS AND HOSPITAL COURSE: For full details of H and P, please see the dictation on 09/30 by Dr. Imogene Burnhen, but briefly this is a 59 year old, African American male with history of seizures in the past secondary to alcoholic hypertension and diabetes. He does not take any medications. He uses cocaine on a frequent basis, almost daily. He presents with seizure activity. Initially he was having a postictal state and was not a very reliable, but he was admitted  to the hospitalist service with CIWA protocol. It was initially thought that this was likely alcohol induced. He was started on fall and seizure precautions as well and did require some benzodiazepines, but came to shortly after and stated that he has not been using alcohol for several months. I had a discussion with the wife, who stated that he drinks beer on occasion and last was probably about a month ago or so, but he has not been drinking on a regular basis. He did state that he has had cocaine almost daily. Neurology was consulted as well psychiatry. We believe that the seizures are likely induced from cocaine, however. An EEG and MRI was obtained. EEG was read by Dr. Malvin JohnsPotter, who is also seeing him while in the hospital. There is no focal seizure activity, but given the history of seizures in the past, anticonvulsant was recommended. The patient has no insurance and cannot afford Keppra, which was recommended by neurology and at this point, he will be discharged on phenytoin. He states that he will not use cocaine anymore. He was counseled extensively about not driving and being cleared by neurology before he would drive and he is agreeable to that. If the seizures occur while the patient is on not using cocaine, then he needs antiseizure medicine indefinitely until he is cleared by neurology. In regards to his blood pressure, he does have some chronic kidney disease and he states he does not have insurance currently, but his family is applying for Medicaid. At this point,  he will be discharged on lisinopril low-dose. His GFR is greater than 60. He does have a history of diabetes. His hemoglobin A1c was 6.0. It is borderline as he is not on any medications, could be diet controlled at this point. He was instructed to follow with his PCP at [facility] for further care. He likely does have underlying chronic kidney disease and should monitor his kidney function as an outpatient.   PHYSICAL  EXAMINATION:  VITAL SIGNS: On the day of discharge, his vitals are stable. He has no fever. Temperature 98.1, pulse 86, respiratory rate 20, blood pressure 153/91, oxygen saturation 96%.  GENERAL: He is a well-developed male. HEENT: Normocephalic, atraumatic.  NECK: Supple.  HEART: Regular rate and rhythm on auscultation of the heart sounds. ABDOMEN: Benign.   LUNGS: Clear.  NEUROLOGIC: Intact and he has been awake, alert, oriented x 3 without any signs of withdrawal.  At this point, he will be discharged.   TOTAL TIME SPENT: 40 minutes.   CODE STATUS: The patient is full code.  ____________________________ Krystal Eaton, MD sa:aw D: 11/18/2012 10:50:16 ET T: 11/18/2012 11:12:28 ET JOB#: [id number]  cc: Krystal Eaton, MD, <Dictator> Audery Amel, MD Paulita Fujita Malvin Johns, MD Phineas Real [facility] MD ELECTRONICALLY SIGNED 12/01/2012 14:15

## 2014-06-08 NOTE — Consult Note (Signed)
Psychiatry: Followup with this gentleman who had a seizure probably related to cocaine use. Today he is awake alert oriented and showing no problems with mood or cognition. Patient continues to state that he had not been using alcohol recently but admits to frequent cocaine use. Counseled patient on the dangers of continued cocaine use and strongly encouraged him to seek outpatient assistance. He states that he intends to do that. No acute dangerousness. No further psychiatric treatment at this point. Notes reviewed. And lab tests reviewed.  Electronic Signatures: Audery Amellapacs, John T (MD)  (Signed on 02-Oct-14 15:41)  Authored  Last Updated: 02-Oct-14 15:41 by Audery Amellapacs, John T (MD)

## 2014-06-08 NOTE — Consult Note (Signed)
Brief Consult Note: Diagnosis: cocaine dependence.   Patient was seen by consultant.   Consult note dictated.   Comments: Psychiatry: Patient seen and chart reviewed. Labs reviewed. I attempted to call his family but was not able to reach anyone on the telephone. Patient who presented with a seizure. Currently to my exam he is awake and alert and oriented and not delirious. No sign of any other mental illness. The patient claims that he has not had any alcohol since February. He admits that he continues to use cocaine and marijuana. If that is accurate than this could not be in alcohol withdrawal seizure. It could still be related to his cocaine use. The question of whether he needs anticonvulsants I will leave up to neurology. Did some counseling and encouraged him to stop the use of cocaine. He is quite aware of the risks. He is willing to at least consider treatment. I will check in with him tomorrow and make another effort to reach his family. No medication orders.  Electronic Signatures: Audery Amellapacs, John T (MD)  (Signed 01-Oct-14 21:33)  Authored: Brief Consult Note   Last Updated: 01-Oct-14 21:33 by Audery Amellapacs, John T (MD)

## 2014-06-08 NOTE — Consult Note (Signed)
Brief Consult Note: Comments: Came by to do consult but patient is off the floor at EEG. I will check back later.  Electronic Signatures: Audery Amellapacs, John T (MD)  (Signed 01-Oct-14 14:45)  Authored: Brief Consult Note   Last Updated: 01-Oct-14 14:45 by Audery Amellapacs, John T (MD)

## 2014-06-08 NOTE — H&P (Signed)
PATIENT NAME:  Carlos Contreras, Carlos Contreras MR#:  [id number] DATE OF BIRTH:  11/16/1955  DATE OF ADMISSION:  11/15/2012  PRIMARY CARE PHYSICIAN:  Nonlocal.  REFERRING PHYSICIAN:  Maurilio LovelyNoelle McLaurin, MD  CHIEF COMPLAINT: Seizure.   HISTORY OF PRESENT ILLNESS: A 59 year old African American male with a history of seizure due to alcohol, hypertension, diabetes, tobacco abuse, alcohol abuse, CKD, was sent by EMS today due to seizure. The patient is confused, unable to provide any information. According to Dr. Glenetta HewMcLaurin, she said EMS send the patient here saying the patient had a seizure. The patient stopped alcohol and drug abuse 3 days ago but no detailed information. Dr. Glenetta HewMcLaurin called the patient's family and no response. She left a message. The patient's CAT scan of head is negative. The patient is confused, he does not know what happened and he only is saying he had a seizure yesterday but could not answer any questions   PAST MEDICAL HISTORY: Seizure due to alcohol, hypertension, diabetes, tobacco and alcohol abuse, renal failure, rhabdomyolysis.    SURGICAL HISTORY: Unknown.   FAMILY HISTORY: Unknown.   SOCIAL HISTORY: According to previous documents, the patient has tobacco abuse, alcohol abuse and drug abuse.   REVIEW OF SYSTEMS: Unable to obtain.   ALLERGIES: No.   HOME MEDICATIONS: Unable to confirm at this time.   PHYSICAL EXAMINATION VITAL SIGNS: Temperature 98, blood pressure 147/78, pulse 80, respirations 18, O2 saturation 95% on room air.  GENERAL: The patient is confused, unable to communicate, does not follow commands  HEENT: Pupils round, equal and reactive to light. Unable to examine mouth but no discharge from ear or nose.  NECK: Supple. No JVD or carotid bruits. No lymphadenopathy. No thyromegaly.  CARDIOVASCULAR: S1, S2, regular rate and rhythm. No murmurs, rubs, gallops.  PULMONARY: Bilateral air entry. No wheezing or rales. No use of accessory muscle to breathe.  ABDOMEN: Soft. No  distention, no obvious tenderness. No organomegaly, bowel sounds present.  EXTREMITIES: No edema, clubbing or cyanosis. No calf tenderness.  SKIN: No rash or jaundice.  NEUROLOGIC: The patient is confused, unable to examine at this time.   LABORATORY AND RADIOLOGICAL DATA: CAT scan of the head did not show any acute abnormality. WBC 8.0, hemoglobin 16.6, platelets 251. Glucose 109, BUN 12, creatinine 1.45, sodium 137, potassium 3.4, chloride 104, bicarbonate 28. Bilirubin 1.0, SGPT 26, SGOT 37, magnesium 1.9. EKG showed sinus tachycardia at 104 bpm, left ventricular hypertrophy.   IMPRESSIONS 1.  Seizure episode, possibly due to alcohol withdrawal.  2.  Altered mental status due to seizure and alcohol withdrawal. 3.  Possible alcohol withdraw.   4.  Hypokalemia.  5.  Hypertension.  6.  Diabetes.  7.  Chronic kidney disease.  8.  History of alcohol abuse, drug abuse and tobacco abuse.  PLAN OF TREATMENT 1.  The patient will be admitted to a medical floor. We will start seizure, fall and aspiration precautions. We will start CIWA protocol and follow drug screen.  2.  We will give potassium and follow up BMP.  3.  We will give hydralazine p.r.n. for hypertension.  4.  For diabetes, we will start sliding scale.  5.  GI and deep vein thrombosis prophylaxis.  6.  Unable to contact the patient's family member.   TIME SPENT: About 42 minutes.    ____________________________ Shaune PollackQing Markiya Keefe, MD qc:cs D: 11/15/2012 20:01:00 ET T: 11/15/2012 20:30:46 ET JOB#: [id number]  cc: Shaune PollackQing Cambryn Charters, MD, <Dictator> Shaune PollackQING Jacquline Terrill MD ELECTRONICALLY SIGNED 11/17/2012 15:05

## 2014-06-08 NOTE — Consult Note (Signed)
Referring Physician:  Demetrios Loll :   Primary Care Physician:  Nonlocal MD, MD :   Reason for Consult: Admit Date: 15-Nov-2012  Chief Complaint: Seizure  Reason for Consult: seizure   History of Present Illness: History of Present Illness:   59 year old man with a history of alcohol withdrawal related seizures and complex medical history including ongoing crack use, DM, HTN presents because of seizure.  Per the patient's wife, the patient had been using crack this past weekend, then on Monday she saw him have a large seizure.  He was taken to the ED and found to be very confused.  Patient says he has not touched alcohol since Feb 2014.  No other provoking factors for the seizure besides the crack use.  Patient lost memory during it.  Full body shaking.  Confused afterwards.  Similar to prior seizures.  Severe.   MEDICAL HISTORY: secondary to alcohol withdrawal abusefailure  HISTORY:denies HISTORY: known FH of neurologic disease. HISTORY: crack use, most recently this past weekend, quit alcohol in Feb 2014 per patient and wife.  MEDICATIONS:isn't sure what his home meds are.        ROS:  General denies complaints   HEENT no complaints   Lungs no complaints   Cardiac no complaints   GI no complaints   GU no complaints   Musculoskeletal no complaints   Extremities no complaints   Skin no complaints   Endocrine no complaints   Psych no complaints   Past Medical/Surgical Hx:  CKD:   seizure:   tobacco abuse:   Hypertension:   diabetes:   Alcohol Abuse:   Drug Abuse:   [facility] Current Meds:   Sodium Chloride 0.9%, 1000 ml     Magnesium Sulfate injection 1 gram     Thiamine injection 614 mg     Folic Acid injection 1 mg at 40 ml/hr, Instructions: **additives to one bag daily**If IV rate chosen is greater than 40 ml/hr, then there needs to be a second fluid ordered to infuse rest of the 24 hour period., Stop After: 3 Days  Multivitamin tablet,  ( MVI)  1  tablet(s) Oral daily  - Indication: Prevention and Treatment of Vitamin Deficiencies  Acetaminophen * tablet, ( Tylenol (325 mg) tablet)  650 mg Oral q4h PRN for pain or temp. greater than 100.4  - Indication: Pain/Fever  HePARin injection, 5000 unit(s), Subcutaneous, q8h  Indication: Anticoagulant, Monitor Anticoags per hospital protocol  MorphINE  injection, 2 to 4 mg, IV push, q4h PRN for pain  Indication: Pain, [Med Admin Window: 30 mins before or after scheduled dose]  Ondansetron injection, ( Zofran injection )  4 mg, IV push, q4h PRN for Nausea/Vomiting  Indication: Nausea/ Vomiting  Pantoprazole tablet, 40 mg Oral q6am  - Indication: Erosive Esophagitis/ GERD  Instructions:  DO NOT CRUSH  Insulin SS -Novolog injection, Subcutaneous, FSBS before meals and at bedtime  give no insulin if FSBS 0 - 150     2 unit(s) if FSBS 151 - 200     4 unit(s) if FSBS 201 - 250     6 unit(s) if FSBS 251 - 300     8 unit(s) if FSBS 301 - 350     10 unit(s) if FSBS 351 - 400  Call MD if Blood Glucose greater than 400, [Waste Code: Black]  LORazepam injection, ( Ativan injection )  2 mg, IV push, q1h PRN for Anxiety, seizure, sedation.  Stop After:  24 Hours  Indication: Anxiety/ Seizure/ Antiemetic Adjunct/ Preop Sedation, for first 24 hours not to exceed 16 mg/24 hours per Alcohol Detox orders.  Nitroglycerin 2% ointment, 1 inch(es) Topical q6h PRN for angina, hypertension  -Indication:Angina/ Hypertension  Instructions:  FOR SBP > 170 or DBP > 106 for Alcohol Detox orders  hydrALAZINE HCl injection, ( Apresoline injection )  10 mg, IV push, q4-6h PRN for if SBP>180 or DBP>100  Indication: Hypertension/ CHF  Influenza Virus Quadrivalent Vaccine injection, 0.5 ml, Intramuscular, once  Indication: provide Active Immunity to Influenza Strains contained in Vaccine, ***The patient must have a temperature of 100.5 or less, anything greater the patient needs to be afebrile x 24 hours before  administration***, **Latex Free**  Nursing Saline Flush, 3 to 6 ml, IV push, Q1M PRN for IV Maintenance  LORazepam injection, ( Ativan injection )  2 mg, IV push, q2h PRN for anxiety, seizure, sedation.  Stop After:  48 Hours  Indication: Anxiety/ Seizure/ Antiemetic Adjunct/ Preop Sedation, for next 48 hours not to exceed 16 mg/24 hours per Alcohol Detox orders.  amLODIPine tablet, ( Norvasc)  5 mg Oral daily  - Indication: Hypertension/ Angina  LORazepam injection, ( Ativan injection )  1 mg, IV push, q2h PRN for anxiety, seizure, sedation.  Stop After:  48 Hours  Indication: Anxiety/ Seizure/ Antiemetic Adjunct/ Preop Sedation, for next 48 hours not to exceed 16 mg/24 hours per Alcohol Detox orders.  Allergies:  No Known Allergies:   Vital Signs: **Vital Signs.:   01-Oct-14 14:00  Temperature Temperature (F) 98.2  Celsius 36.7  Temperature Source oral  Pulse Pulse 71  Respirations Respirations 18  Systolic BP Systolic BP 419  Diastolic BP (mmHg) Diastolic BP (mmHg) 92  Mean BP 109  Pulse Ox % Pulse Ox % 97  Pulse Ox Activity Level  At rest  Oxygen Delivery Room Air/ 21 %   EXAM: PHYSICAL EXAMINATION  GENERAL: Pleasant.  Interactive.  Engaging.  NAD.  Normocephalic and atraumatic.  Back to baseline per patient and wife.  EYES: Funduscopic exam shows normal disc size, appearance and C/D ratio without clear evidence of papilledema.  CARDIOVASCULAR: S1 and S2 sounds are within normal limits, without murmurs, gallops, or rubs.  MUSCULOSKELETAL: Bulk - Normal Tone - Normal Pronator Drift - Absent bilaterally. Ambulation - Gait and station are within normal limits. Romberg - Absent  R/L 5/5    Shoulder abduction (deltoid/supraspinatus, axillary/suprascapular n, C5) 5/5    Elbow flexion (biceps brachii, musculoskeletal n, C5-6) 5/5    Elbow extension (triceps, radial n, C7) 5/5    Finger adduction (interossei, ulnar n, T1)   5/5    Hip flexion (iliopsoas, L1/L2) 5/5     Knee flexion (hamstrings, sciatic n, L5/S1) 5/5    Knee extension (quadriceps, femoral n, L3/4) 5/5    Ankle dorsiflexion (tibialis anterior, deep fibular n, L4/5) 5/5    Ankle plantarflexion (gastroc, tibial n, S1)  NEUROLOGICAL: MENTAL STATUS: Patient is oriented to person, place and time.  Recent and remote memory are intact.  Attention span and concentration are intact.  Naming, repetition, comprehension and expressive speech are within normal limits.  Patient's fund of knowledge is within normal limits for educational level.  CRANIAL NERVES: Normal    CN II (normal visual acuity and visual fields) Normal    CN III, IV, VI (extraocular muscles are intact) Normal    CN V (facial sensation is intact bilaterally) Normal    CN VII (facial strength is intact bilaterally) Normal  CN VIII (hearing is intact bilaterally) Normal    CN IX/X (palate elevates midline, normal phonation) Normal    CN XI (shoulder shrug strength is normal and symmetric) Normal    CN XII (tongue protrudes midline)   SENSATION: Intact to pain and temp bilaterally (spinothalamic tracts) Intact to position and vibration bilaterally (dorsal columns)   REFLEXES: R/L 2+/2+    Biceps 2+/2+    Brachioradialis   2+/2+    Patellar 2+/2+    Achilles   COORDINATION/CEREBELLAR: Finger to nose testing is within normal limits..  Lab Results:  Thyroid:  01-Oct-14 04:42   Thyroid Stimulating Hormone 0.573 (0.45-4.50 (International Unit)  ----------------------- Pregnant patients have  different reference  ranges for TSH:  - - - - - - - - - -  Pregnant, first trimetser:  0.36 - 2.50 uIU/mL)  Hepatic:  30-Sep-14 16:36   Bilirubin, Total 1.0  Bilirubin, Direct 0.20 (Result(s) reported on 15 Nov 2012 at 05:37PM.)  Alkaline Phosphatase 94  SGPT (ALT) 26  SGOT (AST) 37  Total Protein, Serum 7.9  Albumin, Serum 4.2  Routine Chem:  30-Sep-14 16:36   Ethanol, S. < 3  Ethanol % (comp) < 0.003 (Result(s)  reported on 15 Nov 2012 at 05:37PM.)  01-Oct-14 04:42   Hemoglobin A1c ([facility]) 6.2 (The American Diabetes Association recommends that a primary goal of therapy should be <7% and that physicians should reevaluate the treatment regimen in patients with HbA1c values consistently >8%.)  Glucose, Serum 85  BUN 13  Creatinine (comp)  1.32  Sodium, Serum 139  Potassium, Serum  3.4  Chloride, Serum  108  CO2, Serum 26  Calcium (Total), Serum 9.5  Anion Gap  5  Osmolality (calc) 277  eGFR (African American) >60  eGFR (Non-African American) >60 (eGFR values <77m/min/1.73 m2 may be an indication of chronic kidney disease (CKD). Calculated eGFR is useful in patients with stable renal function. The eGFR calculation will not be reliable in acutely ill patients when serum creatinine is changing rapidly. It is not useful in  patients on dialysis. The eGFR calculation may not be applicable to patients at the low and high extremes of body sizes, pregnant women, and vegetarians.)  Magnesium, Serum 2.0 (1.8-2.4 THERAPEUTIC RANGE: 4-7 mg/dL TOXIC: > 10 mg/dL  -----------------------)  Cholesterol, Serum 172  Triglycerides, Serum 97  HDL (INHOUSE) 40  VLDL Cholesterol Calculated 19  LDL Cholesterol Calculated  113 (Result(s) reported on 16 Nov 2012 at 05:12AM.)  Urine Drugs:  [id number]  Tricyclic Antidepressant, Ur Qual (comp) NEGATIVE (Result(s) reported on 15 Nov 2012 at 08:34PM.)  Amphetamines, Urine Qual. NEGATIVE  MDMA, Urine Qual. NEGATIVE  Cocaine Metabolite, Urine Qual. POSITIVE  Opiate, Urine qual NEGATIVE  Phencyclidine, Urine Qual. NEGATIVE  Cannabinoid, Urine Qual. POSITIVE  Barbiturates, Urine Qual. NEGATIVE  Benzodiazepine, Urine Qual. NEGATIVE (----------------- The URINE DRUG SCREEN provides only a preliminary, unconfirmed analytical test result and should not be used for non-medical  purposes.  Clinical consideration and professional judgment should be  applied to any  positive drug screen result due to possible interfering substances.  A more specific alternate chemical method must be used in order to obtain a confirmed analytical result.  Gas chromatography/mass spectrometry (GC/MS) is the preferred confirmatory method.)  Methadone, Urine Qual. NEGATIVE  Routine UA:  30-Sep-14 16:36   Color (UA) Yellow  Clarity (UA) Cloudy  Glucose (UA) Negative  Bilirubin (UA) Negative  Ketones (UA) Trace  Specific Gravity (UA) 1.021  Blood (UA) Negative  pH (UA) 5.0  Protein (UA) 30 mg/dL  Nitrite (UA) Negative  Leukocyte Esterase (UA) Negative (Result(s) reported on 15 Nov 2012 at 08:27PM.)  RBC (UA) NONE SEEN  WBC (UA) 2 /HPF  Bacteria (UA) NONE SEEN  Epithelial Cells (UA) NONE SEEN  Mucous (UA) PRESENT (Result(s) reported on 15 Nov 2012 at 08:27PM.)  Routine Hem:  01-Oct-14 04:42   WBC (CBC) 5.9  RBC (CBC) 5.74  Hemoglobin (CBC) 15.7  Hematocrit (CBC) 46.3  Platelet Count (CBC) 237  MCV 81  MCH 27.3  MCHC 33.9  RDW 14.3  Neutrophil % 41.2  Lymphocyte % 49.3  Monocyte % 8.7  Eosinophil % 0.2  Basophil % 0.6  Neutrophil # 2.4  Lymphocyte # 2.9  Monocyte # 0.5  Eosinophil # 0.0  Basophil # 0.0 (Result(s) reported on 16 Nov 2012 at 05:07AM.)   Radiology Results: CT:    30-Sep-14 19:00, CT Head Without Contrast  CT Head Without Contrast   REASON FOR EXAM:    seizure and AMS  COMMENTS:       PROCEDURE: CT  - CT HEAD WITHOUT CONTRAST  - Nov 15 2012  7:00PM     RESULT: Axial noncontrast CT scanning was performed through the brain   with reconstructions at 5 mm intervals and slice thicknesses.    Comparison is made to study of April 23, 2012.    The ventricles are normal in size and position. There is no intracranial   hemorrhage nor intracranial mass effect. The cerebellum and brainstem are   normal in density. There is no evidence ofan evolving ischemic   infarction. At bone window settings the observed portions of the   paranasal  sinuses and mastoid air cells are clear. There is no evidence     of an acute skull fracture.    IMPRESSION:   1. There is no acute abnormality of thebrain.  2. There is no acute skull fracture.     Dictation Site: 5        Verified By: DAVID A. Martinique, M.D., MD   Impression/Recommendations: Recommendations:   59 year old man with a history of alcohol withdrawal related seizures and complex medical history including ongoing crack use, DM, HTN presents because of seizure.  discussion with patient and wife today that patient needs to refrain from using drugs and alcohol to lower risk for havng more seizures.  I have personally interpreted his routine EEG which is wnl which is reassuring.  Would rec he be followed closely by neurology as an outpatient and if he has seizures unrelated to drug or alcohol use then I would consider more long term monitoring to try to better characterize his potential seizure disorder.  Despite likely provoked nature of this seizure related to his crack use I would rec that the patient be started on an anticonvulsant such as Keppra 500 mg bid given that he is likely to again encounter the same provoking factors given his history.  Would rec outpatient neuro followup and close supervision.  Discussed seizure precautions, SUDEP, and [place] law to report most recent seizure to Doctor [facility] who will determine when and if it is possible for patient to get his license back at some point int he future.  State law indicates it the responsibility of the patient to report their own seizure disorder to the [facility].  Patient expresses interest in quitting his drug habit.  Would encourage him to consider a support group to help cement this behavior change and  prevent relapse.   have reviewed the results of the most recent imaging studies, tests and labs as outlined above and answered all related questions.     Electronic Signatures: Anabel Bene (MD)  (Signed 02-Oct-14  23:01)  Authored: REFERRING PHYSICIAN, Primary Care Physician, Consult, History of Present Illness, Review of Systems, PAST MEDICAL/SURGICAL HISTORY, HOME MEDICATIONS, Current Medications, ALLERGIES, NURSING VITAL SIGNS, Physical Exam-, LAB RESULTS, RADIOLOGY RESULTS, Recommendations   Last Updated: 02-Oct-14 23:01 by Anabel Bene (MD)

## 2015-02-25 ENCOUNTER — Encounter: Payer: Self-pay | Admitting: *Deleted

## 2015-02-25 DIAGNOSIS — S3992XA Unspecified injury of lower back, initial encounter: Secondary | ICD-10-CM | POA: Diagnosis present

## 2015-02-25 DIAGNOSIS — Z79899 Other long term (current) drug therapy: Secondary | ICD-10-CM | POA: Diagnosis not present

## 2015-02-25 DIAGNOSIS — Z87891 Personal history of nicotine dependence: Secondary | ICD-10-CM | POA: Diagnosis not present

## 2015-02-25 DIAGNOSIS — W000XXA Fall on same level due to ice and snow, initial encounter: Secondary | ICD-10-CM | POA: Diagnosis not present

## 2015-02-25 DIAGNOSIS — Y998 Other external cause status: Secondary | ICD-10-CM | POA: Diagnosis not present

## 2015-02-25 DIAGNOSIS — Y9289 Other specified places as the place of occurrence of the external cause: Secondary | ICD-10-CM | POA: Insufficient documentation

## 2015-02-25 DIAGNOSIS — Y93H1 Activity, digging, shoveling and raking: Secondary | ICD-10-CM | POA: Diagnosis not present

## 2015-02-25 NOTE — ED Notes (Signed)
Patient states he fell while shoveling snow. Patient c/o back pain that radiates down right leg. Patient states he took two Ibuprofen at 1800 today with minimal relief.

## 2015-02-26 ENCOUNTER — Emergency Department: Payer: Medicare Other

## 2015-02-26 ENCOUNTER — Emergency Department
Admission: EM | Admit: 2015-02-26 | Discharge: 2015-02-26 | Disposition: A | Discharge status: Home / Self Care | Payer: Medicare Other | Attending: Emergency Medicine | Admitting: Emergency Medicine

## 2015-02-26 DIAGNOSIS — S3992XA Unspecified injury of lower back, initial encounter: Secondary | ICD-10-CM

## 2015-02-26 DIAGNOSIS — M545 Low back pain: Secondary | ICD-10-CM

## 2015-02-26 HISTORY — DX: Unspecified convulsions: R56.9

## 2015-02-26 MED ORDER — KETOROLAC TROMETHAMINE 60 MG/2ML IM SOLN
INTRAMUSCULAR | Status: AC
  Filled 2015-02-26: qty 2

## 2015-02-26 MED ORDER — CYCLOBENZAPRINE HCL 10 MG PO TABS
10.0000 mg | ORAL_TABLET | Freq: Once | ORAL | Status: AC
  Administered 2015-02-26: 10 mg via ORAL

## 2015-02-26 MED ORDER — CYCLOBENZAPRINE HCL 10 MG PO TABS: 10.0000 mg | ORAL_TABLET | Freq: Three times a day (TID) | ORAL | Status: DC | PRN

## 2015-02-26 MED ORDER — CYCLOBENZAPRINE HCL 10 MG PO TABS
ORAL_TABLET | ORAL | Status: AC
  Filled 2015-02-26: qty 1

## 2015-02-26 MED ORDER — KETOROLAC TROMETHAMINE 60 MG/2ML IM SOLN
60.0000 mg | Freq: Once | INTRAMUSCULAR | Status: AC
  Administered 2015-02-26: 60 mg via INTRAMUSCULAR

## 2015-02-26 NOTE — ED Notes (Signed)
Pt  Dc home ambulatory pain unchanged instructed on follow up plan and med use PT NAD AT DC

## 2015-02-26 NOTE — ED Provider Notes (Signed)
[facility] Emergency Department Provider Note  ____________________________________________  Time seen: 2:15 AM  I have reviewed the triage vital signs and the nursing notes.   HISTORY  Chief Complaint Fall     HPI Carlos Contreras is a 60 y.o. male presents with history of accidental slip and fall on ice with resultant low back injury. Patient states that the pain is radiating down his posterior right leg. Patient denies any leg weakness or numbness. Current pain score 8 out of 10    Past Medical History  Diagnosis Date  . Seizures [facility](HCC)     Patient Active Problem List   Diagnosis Date Noted  . Alcohol dependence (HCC) 05/12/2013  . Cannabis dependence, continuous (HCC) 05/05/2013  . Cocaine dependence with or without physiological dependence (HCC) 05/05/2013    History reviewed. No pertinent past surgical history.  Current Outpatient Rx  Name  Route  Sig  Dispense  Refill  . cyclobenzaprine (FLEXERIL) 10 MG tablet   Oral   Take 1 tablet (10 mg total) by mouth 3 (three) times daily as needed for muscle spasms.   30 tablet   0   . lisinopril (PRINIVIL,ZESTRIL) 10 MG tablet   Oral   Take 10 mg by mouth daily.           Allergies Review of patient's allergies indicates no known allergies.  Family History  Problem Relation Age of Onset  . Alcohol abuse Father   . Alcohol abuse Maternal Uncle   . Alcohol abuse Paternal Grandfather     Social History Social History  Substance Use Topics  . Smoking status: Former Games developermoker  . Smokeless tobacco: None  . Alcohol Use: No    Review of Systems  Constitutional: Negative for fever. Eyes: Negative for visual changes. ENT: Negative for sore throat. Cardiovascular: Negative for chest pain. Respiratory: Negative for shortness of breath. Gastrointestinal: Negative for abdominal pain, vomiting and diarrhea. Genitourinary: Negative for dysuria. Musculoskeletal: Positive for back  pain. Skin: Negative for rash. Neurological: Negative for headaches, focal weakness or numbness.   10-point ROS otherwise negative.  ____________________________________________   PHYSICAL EXAM:  VITAL SIGNS: ED Triage Vitals  Enc Vitals Group     BP 02/25/15 2252 176/98 mmHg     Pulse Rate 02/25/15 2252 50     Resp 02/25/15 2252 18     Temp 02/25/15 2252 98.1 F (36.7 C)     Temp Source 02/25/15 2252 Oral     SpO2 02/25/15 2252 97 %     Weight 02/25/15 2252 180 lb (81.647 kg)     Height 02/25/15 2252 5\' 5"  (1.651 m)     Head Cir --      Peak Flow --      Pain Score 02/25/15 2255 8     Pain Loc --      Pain Edu? --      Excl. in GC? --      Constitutional: Alert and oriented. Well appearing and in no distress. Eyes: Conjunctivae are normal. PERRL. Normal extraocular movements. ENT   Head: Normocephalic and atraumatic.   Nose: No congestion/rhinnorhea.   Mouth/Throat: Mucous membranes are moist.   Neck: No stridor. Hematological/Lymphatic/Immunilogical: No cervical lymphadenopathy. Cardiovascular: Normal rate, regular rhythm. Normal and symmetric distal pulses are present in all extremities. No murmurs, rubs, or gallops. Respiratory: Normal respiratory effort without tachypnea nor retractions. Breath sounds are clear and equal bilaterally. No wheezes/rales/rhonchi. Gastrointestinal: Soft and nontender. No distention. There is no CVA  tenderness. Genitourinary: deferred Musculoskeletal: Nontender with normal range of motion in all extremities. No joint effusions.  No lower extremity tenderness nor edema. Right lumbar paraspinal muscle tenderness to palpation Neurologic:  Normal speech and language. No gross focal neurologic deficits are appreciated. Speech is normal.  Skin:  Skin is warm, dry and intact. No rash noted. Psychiatric: Mood and affect are normal. Speech and behavior are normal. Patient exhibits appropriate insight and judgment.    INITIAL  IMPRESSION / ASSESSMENT AND PLAN / ED COURSE  Pertinent labs & imaging results that were available during my care of the patient were reviewed by me and considered in my medical decision making (see chart for details).  Patient received IM Toradol 60 mg as well Flexeril 10 mg tablet  ____________________________________________   FINAL CLINICAL IMPRESSION(S) / ED DIAGNOSES  Final diagnoses:  Back injury, initial encounter  Right low back pain, with sciatica presence unspecified      Darci Current, MD 02/26/15 [phone]

## 2015-02-26 NOTE — Discharge Instructions (Signed)

## 2015-03-06 ENCOUNTER — Emergency Department: Payer: Self-pay

## 2015-03-06 ENCOUNTER — Emergency Department
Admission: EM | Admit: 2015-03-06 | Discharge: 2015-03-06 | Disposition: A | Discharge status: Home / Self Care | Payer: Self-pay | Attending: Emergency Medicine | Admitting: Emergency Medicine

## 2015-03-06 ENCOUNTER — Encounter: Payer: Self-pay | Admitting: *Deleted

## 2015-03-06 DIAGNOSIS — Y9389 Activity, other specified: Secondary | ICD-10-CM | POA: Insufficient documentation

## 2015-03-06 DIAGNOSIS — S79911A Unspecified injury of right hip, initial encounter: Secondary | ICD-10-CM | POA: Insufficient documentation

## 2015-03-06 DIAGNOSIS — Z79899 Other long term (current) drug therapy: Secondary | ICD-10-CM | POA: Insufficient documentation

## 2015-03-06 DIAGNOSIS — S8991XA Unspecified injury of right lower leg, initial encounter: Secondary | ICD-10-CM | POA: Insufficient documentation

## 2015-03-06 DIAGNOSIS — M25551 Pain in right hip: Secondary | ICD-10-CM

## 2015-03-06 DIAGNOSIS — Y9289 Other specified places as the place of occurrence of the external cause: Secondary | ICD-10-CM | POA: Insufficient documentation

## 2015-03-06 DIAGNOSIS — Z87891 Personal history of nicotine dependence: Secondary | ICD-10-CM | POA: Insufficient documentation

## 2015-03-06 DIAGNOSIS — W010XXA Fall on same level from slipping, tripping and stumbling without subsequent striking against object, initial encounter: Secondary | ICD-10-CM | POA: Insufficient documentation

## 2015-03-06 DIAGNOSIS — Y998 Other external cause status: Secondary | ICD-10-CM | POA: Insufficient documentation

## 2015-03-06 MED ORDER — OXYCODONE-ACETAMINOPHEN 5-325 MG PO TABS: 1.0000 | ORAL_TABLET | ORAL | Status: DC | PRN

## 2015-03-06 MED ORDER — NAPROXEN 500 MG PO TABS: 500.0000 mg | ORAL_TABLET | Freq: Two times a day (BID) | ORAL | Status: DC

## 2015-03-06 MED ORDER — METHOCARBAMOL 500 MG PO TABS: 500.0000 mg | ORAL_TABLET | Freq: Four times a day (QID) | ORAL | Status: DC | PRN

## 2015-03-06 NOTE — Discharge Instructions (Signed)

## 2015-03-06 NOTE — ED Notes (Signed)
Pt states right leg pain from his hip to knee cap, states he slipped on ice last week and was seen in Er for initial back pain but now the pain is in his leg

## 2015-03-06 NOTE — ED Provider Notes (Signed)
[facility] Emergency Department Provider Note  ____________________________________________  Time seen: Approximately 9:07 AM  I have reviewed the triage vital signs and the nursing notes.   HISTORY  Chief Complaint Leg Pain    HPI Carlos Contreras is a 60 y.o. male presents for evaluation of right leg pain from the hip to knee. Patient reports he slipped on ice last week was seen in the ER for initial back pain but none still continues to have pain in his leg. No relief with intermittent use of Vicodin and ibuprofen.   Past Medical History  Diagnosis Date  . Seizures [facility])     Patient Active Problem List   Diagnosis Date Noted  . Alcohol dependence (HCC) 05/12/2013  . Cannabis dependence, continuous (HCC) 05/05/2013  . Cocaine dependence with or without physiological dependence (HCC) 05/05/2013    History reviewed. No pertinent past surgical history.  Current Outpatient Rx  Name  Route  Sig  Dispense  Refill  . lisinopril (PRINIVIL,ZESTRIL) 10 MG tablet   Oral   Take 10 mg by mouth daily.         . methocarbamol (ROBAXIN) 500 MG tablet   Oral   Take 1 tablet (500 mg total) by mouth every 6 (six) hours as needed for muscle spasms.   30 tablet   0   . naproxen (NAPROSYN) 500 MG tablet   Oral   Take 1 tablet (500 mg total) by mouth 2 (two) times daily with a meal.   60 tablet   0   . oxyCODONE-acetaminophen (ROXICET) 5-325 MG tablet   Oral   Take 1-2 tablets by mouth every 4 (four) hours as needed for severe pain.   8 tablet   0     Allergies Review of patient's allergies indicates no known allergies.  Family History  Problem Relation Age of Onset  . Alcohol abuse Father   . Alcohol abuse Maternal Uncle   . Alcohol abuse Paternal Grandfather     Social History Social History  Substance Use Topics  . Smoking status: Former Games developer  . Smokeless tobacco: None  . Alcohol Use: No    Review of Systems Constitutional: No  fever/chills Eyes: No visual changes. ENT: No sore throat. Cardiovascular: Denies chest pain. Respiratory: Denies shortness of breath. Gastrointestinal: No abdominal pain.  No nausea, no vomiting.  No diarrhea.  No constipation. Genitourinary: Negative for dysuria. Musculoskeletal: Positive for right hip pelvis and leg pain. Skin: Negative for rash. Neurological: Negative for headaches, focal weakness or numbness.  10-point ROS otherwise negative.  ____________________________________________   PHYSICAL EXAM:  VITAL SIGNS: ED Triage Vitals  Enc Vitals Group     BP 03/06/15 0849 140/66 mmHg     Pulse Rate 03/06/15 0849 77     Resp 03/06/15 0849 20     Temp 03/06/15 0849 98.3 F (36.8 C)     Temp Source 03/06/15 0849 Oral     SpO2 03/06/15 0849 96 %     Weight 03/06/15 0849 180 lb (81.647 kg)     Height 03/06/15 0849  (1.651 m)     Head Cir --      Peak Flow --      Pain Score 03/06/15 0849 10     Pain Loc --      Pain Edu? --      Excl. in GC? --     Constitutional: Alert and oriented. Well appearing and in no acute distress. Eyes: Conjunctivae are  normal. PERRL. EOMI. Head: Atraumatic. Nose: No congestion/rhinnorhea. Mouth/Throat: Mucous membranes are moist.  Oropharynx non-erythematous. Neck: No stridor.   Cardiovascular: Normal rate, regular rhythm. Grossly normal heart sounds.  Good peripheral circulation. Respiratory: Normal respiratory effort.  No retractions. Lungs CTAB. Gastrointestinal: Soft and nontender. No distention. No abdominal bruits. No CVA tenderness. Musculoskeletal: No lower extremity tenderness nor edema.  No joint effusions. Neurologic:  Normal speech and language. No gross focal neurologic deficits are appreciated. No gait instability. Skin:  Skin is warm, dry and intact. No rash noted. Psychiatric: Mood and affect are normal. Speech and behavior are normal.  ____________________________________________   LABS (all labs ordered are  listed, but only abnormal results are displayed)  Labs Reviewed - No data to display ____________________________________________   RADIOLOGY  FINDINGS: There is no fracture or dislocation. Minimal osteophyte formation on the inferior and posterior aspects of the right acetabulum. No joint space narrowing. Pelvic bones are intact.  IMPRESSION: No acute abnormality. Minimal arthritic changes of the right hip. ____________________________________________   PROCEDURES  Procedure(s) performed: None  Critical Care performed: No  ____________________________________________   INITIAL IMPRESSION / ASSESSMENT AND PLAN / ED COURSE  Pertinent labs & imaging results that were available during my care of the patient were reviewed by me and considered in my medical decision making (see chart for details).  Status post fall with right hip contusion with leg pain. Rx given for Robaxin 500 mg 4 times a day, Naprosyn 500 mg twice a day, and Percocet 5/325 #8. Patient follow-up with pain management Dr. Metta Clines or return to the ER as needed. ____________________________________________   FINAL CLINICAL IMPRESSION(S) / ED DIAGNOSES  Final diagnoses:  Hip pain, right      Evangeline Dakin, PA-C 03/06/15 1016  Arnaldo Natal, MD 03/06/15 [phone]

## 2015-04-12 ENCOUNTER — Other Ambulatory Visit: Payer: Self-pay | Admitting: Physician Assistant

## 2015-04-12 DIAGNOSIS — S83203A Other tear of unspecified meniscus, current injury, right knee, initial encounter: Secondary | ICD-10-CM

## 2015-05-03 ENCOUNTER — Ambulatory Visit
Admission: RE | Admit: 2015-05-03 | Discharge: 2015-05-03 | Disposition: A | Discharge status: Home / Self Care | Payer: Medicare Other | Source: Ambulatory Visit | Attending: Physician Assistant | Admitting: Physician Assistant

## 2015-05-03 DIAGNOSIS — M23300 Other meniscus derangements, unspecified lateral meniscus, right knee: Secondary | ICD-10-CM | POA: Diagnosis not present

## 2015-05-03 DIAGNOSIS — M2391 Unspecified internal derangement of right knee: Secondary | ICD-10-CM | POA: Diagnosis present

## 2015-05-03 DIAGNOSIS — S83203A Other tear of unspecified meniscus, current injury, right knee, initial encounter: Secondary | ICD-10-CM

## 2015-11-06 ENCOUNTER — Encounter: Payer: Self-pay | Admitting: Emergency Medicine

## 2015-11-06 ENCOUNTER — Emergency Department: Payer: Medicare Other

## 2015-11-06 ENCOUNTER — Emergency Department
Admission: EM | Admit: 2015-11-06 | Discharge: 2015-11-06 | Disposition: A | Discharge status: Home / Self Care | Payer: Medicare Other | Attending: Emergency Medicine | Admitting: Emergency Medicine

## 2015-11-06 DIAGNOSIS — Y999 Unspecified external cause status: Secondary | ICD-10-CM | POA: Diagnosis not present

## 2015-11-06 DIAGNOSIS — Y929 Unspecified place or not applicable: Secondary | ICD-10-CM | POA: Insufficient documentation

## 2015-11-06 DIAGNOSIS — S299XXA Unspecified injury of thorax, initial encounter: Secondary | ICD-10-CM | POA: Diagnosis present

## 2015-11-06 DIAGNOSIS — W1789XA Other fall from one level to another, initial encounter: Secondary | ICD-10-CM | POA: Diagnosis not present

## 2015-11-06 DIAGNOSIS — Z87891 Personal history of nicotine dependence: Secondary | ICD-10-CM | POA: Diagnosis not present

## 2015-11-06 DIAGNOSIS — Y939 Activity, unspecified: Secondary | ICD-10-CM | POA: Insufficient documentation

## 2015-11-06 DIAGNOSIS — S20211A Contusion of right front wall of thorax, initial encounter: Secondary | ICD-10-CM | POA: Insufficient documentation

## 2015-11-06 MED ORDER — MELOXICAM 15 MG PO TABS: 15.0000 mg | ORAL_TABLET | Freq: Every day | ORAL | 0 refills | Status: DC

## 2015-11-06 MED ORDER — CYCLOBENZAPRINE HCL 10 MG PO TABS: 10.0000 mg | ORAL_TABLET | Freq: Three times a day (TID) | ORAL | 0 refills | Status: DC | PRN

## 2015-11-06 NOTE — ED Triage Notes (Signed)
Pt presents with right side rib pain after falling on a trailer hitch two days ago.

## 2015-11-06 NOTE — ED Notes (Signed)
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

## 2015-11-06 NOTE — ED Notes (Signed)
Pt informed to return if any life threatening symptoms occur.  

## 2015-11-06 NOTE — ED Provider Notes (Signed)
[facility] Emergency Department Provider Note ____________________________________________  Time seen: Approximately 10:33 AM  I have reviewed the triage vital signs and the nursing notes.   HISTORY  Chief Complaint Rib Pain    HPI Carlos Contreras is a 60 y.o. male who presents to the emergency department for evaluation of rib pain after falling on a trailer hitch 2 days ago. Pain worse today after lifting heavy fertilizer bags while at work yesterday. He has been taking ibuprofen without relief.  Past Medical History:  Diagnosis Date  . Seizures [facility](HCC)     Patient Active Problem List   Diagnosis Date Noted  . Alcohol dependence (HCC) 05/12/2013  . Cannabis dependence, continuous (HCC) 05/05/2013  . Cocaine dependence with or without physiological dependence (HCC) 05/05/2013    History reviewed. No pertinent surgical history.  Prior to Admission medications   Medication Sig Start Date End Date Taking? Authorizing Provider  cyclobenzaprine (FLEXERIL) 10 MG tablet Take 1 tablet (10 mg total) by mouth 3 (three) times daily as needed for muscle spasms. 11/06/15   Chinita Pesterari B Giavonni Fonder, FNP  lisinopril (PRINIVIL,ZESTRIL) 10 MG tablet Take 10 mg by mouth daily.    Historical Provider, MD  meloxicam (MOBIC) 15 MG tablet Take 1 tablet (15 mg total) by mouth daily. 11/06/15   Chinita Pesterari B Kyndel Egger, FNP  methocarbamol (ROBAXIN) 500 MG tablet Take 1 tablet (500 mg total) by mouth every 6 (six) hours as needed for muscle spasms. 03/06/15   Evangeline Dakinharles M Beers, PA-C    Allergies Review of patient's allergies indicates no known allergies.  Family History  Problem Relation Age of Onset  . Alcohol abuse Father   . Alcohol abuse Maternal Uncle   . Alcohol abuse Paternal Grandfather     Social History Social History  Substance Use Topics  . Smoking status: Former Games developermoker  . Smokeless tobacco: Never Used  . Alcohol use No    Review of Systems Constitutional: No recent  illness. Cardiovascular: Denies chest pain or palpitations. Respiratory: Denies shortness of breath. Musculoskeletal: Pain in Right ribs Skin: Negative for rash, wound, lesion. Neurological: Negative for focal weakness or numbness.  ____________________________________________   PHYSICAL EXAM:  VITAL SIGNS: ED Triage Vitals  Enc Vitals Group     BP 11/06/15 0930 (!) 188/104     Pulse Rate 11/06/15 0930 (!) 58     Resp 11/06/15 0930 20     Temp 11/06/15 0930 97.9 F (36.6 C)     Temp Source 11/06/15 0930 Oral     SpO2 11/06/15 0930 98 %     Weight 11/06/15 0932 180 lb (81.6 kg)     Height --      Head Circumference --      Peak Flow --      Pain Score 11/06/15 0925 8     Pain Loc --      Pain Edu? --      Excl. in GC? --     Constitutional: Alert and oriented. Well appearing and in no acute distress. Eyes: Conjunctivae are normal. EOMI. Head: Atraumatic. Neck: No stridor.  Respiratory: Normal respiratory effort.  Breath sounds clear to auscultation throughout. Musculoskeletal: Tenderness to palpation over the mid, lateral thorax on the right side. Neurologic:  Normal speech and language. No gross focal neurologic deficits are appreciated. Speech is normal. No gait instability. Skin:  Skin is warm, dry and intact. Atraumatic. Psychiatric: Mood and affect are normal. Speech and behavior are normal.  ____________________________________________  LABS (all labs ordered are listed, but only abnormal results are displayed)  Labs Reviewed - No data to display ____________________________________________  RADIOLOGY  Chest x-ray negative for acute bony abnormality per radiology. ____________________________________________   PROCEDURES  Procedure(s) performed: None   ____________________________________________   INITIAL IMPRESSION / ASSESSMENT AND PLAN / ED COURSE  Clinical Course    Pertinent labs & imaging results that were available during my care of  the patient were reviewed by me and considered in my medical decision making (see chart for details).  Patient states Flexeril and meloxicam as prescribed. He is to follow-up with his primary care provider for symptoms that are not improving over the week. He is to return to the emergency department for symptoms that change or worsen if he is unable schedule an appointment. ____________________________________________   FINAL CLINICAL IMPRESSION(S) / ED DIAGNOSES  Final diagnoses:  Rib contusion, right, initial encounter       Chinita Pester, FNP 11/06/15 1045    Sharyn Creamer, MD 11/06/15 1541

## 2015-11-25 ENCOUNTER — Encounter: Payer: Self-pay | Admitting: Emergency Medicine

## 2015-11-25 ENCOUNTER — Emergency Department
Admission: EM | Admit: 2015-11-25 | Discharge: 2015-11-25 | Disposition: A | Discharge status: Home / Self Care | Payer: Medicare Other | Attending: Emergency Medicine | Admitting: Emergency Medicine

## 2015-11-25 DIAGNOSIS — Y999 Unspecified external cause status: Secondary | ICD-10-CM | POA: Diagnosis not present

## 2015-11-25 DIAGNOSIS — Y9222 Religious institution as the place of occurrence of the external cause: Secondary | ICD-10-CM | POA: Insufficient documentation

## 2015-11-25 DIAGNOSIS — T1501XA Foreign body in cornea, right eye, initial encounter: Secondary | ICD-10-CM | POA: Diagnosis present

## 2015-11-25 DIAGNOSIS — Z79899 Other long term (current) drug therapy: Secondary | ICD-10-CM | POA: Insufficient documentation

## 2015-11-25 DIAGNOSIS — Y9389 Activity, other specified: Secondary | ICD-10-CM | POA: Diagnosis not present

## 2015-11-25 DIAGNOSIS — T1591XA Foreign body on external eye, part unspecified, right eye, initial encounter: Secondary | ICD-10-CM

## 2015-11-25 DIAGNOSIS — X58XXXA Exposure to other specified factors, initial encounter: Secondary | ICD-10-CM | POA: Insufficient documentation

## 2015-11-25 DIAGNOSIS — I10 Essential (primary) hypertension: Secondary | ICD-10-CM | POA: Diagnosis not present

## 2015-11-25 DIAGNOSIS — Z791 Long term (current) use of non-steroidal anti-inflammatories (NSAID): Secondary | ICD-10-CM | POA: Insufficient documentation

## 2015-11-25 DIAGNOSIS — Z87891 Personal history of nicotine dependence: Secondary | ICD-10-CM | POA: Diagnosis not present

## 2015-11-25 HISTORY — DX: Essential (primary) hypertension: I10

## 2015-11-25 MED ORDER — HYDROCODONE-ACETAMINOPHEN 5-325 MG PO TABS
1.0000 | ORAL_TABLET | Freq: Once | ORAL | Status: AC
  Administered 2015-11-25: 1 via ORAL
  Filled 2015-11-25: qty 1

## 2015-11-25 MED ORDER — FLUORESCEIN SODIUM 1 MG OP STRP
ORAL_STRIP | OPHTHALMIC | Status: AC
  Administered 2015-11-25: 1 via OPHTHALMIC
  Filled 2015-11-25: qty 1

## 2015-11-25 MED ORDER — TETRACAINE HCL 0.5 % OP SOLN
2.0000 [drp] | Freq: Once | OPHTHALMIC | Status: AC
  Administered 2015-11-25: 2 [drp] via OPHTHALMIC

## 2015-11-25 MED ORDER — KETOROLAC TROMETHAMINE 0.5 % OP SOLN: 1.0000 [drp] | Freq: Four times a day (QID) | OPHTHALMIC | 0 refills | Status: DC

## 2015-11-25 MED ORDER — FLUORESCEIN SODIUM 1 MG OP STRP
1.0000 | ORAL_STRIP | Freq: Once | OPHTHALMIC | Status: AC
  Administered 2015-11-25: 1 via OPHTHALMIC

## 2015-11-25 MED ORDER — CIPROFLOXACIN HCL 0.3 % OP SOLN: 1.0000 [drp] | OPHTHALMIC | 0 refills | Status: AC

## 2015-11-25 MED ORDER — TETRACAINE HCL 0.5 % OP SOLN
OPHTHALMIC | Status: AC
  Administered 2015-11-25: 2 [drp] via OPHTHALMIC
  Filled 2015-11-25: qty 2

## 2015-11-25 MED ORDER — CIPROFLOXACIN HCL 0.3 % OP SOLN
1.0000 [drp] | OPHTHALMIC | Status: DC
  Administered 2015-11-25: 1 [drp] via OPHTHALMIC
  Filled 2015-11-25: qty 2.5

## 2015-11-25 NOTE — ED Provider Notes (Signed)
[facility] Emergency Department Provider Note ____________________________________________  Time seen: Approximately 9:16 PM  I have reviewed the triage vital signs and the nursing notes.   HISTORY  Chief Complaint Foreign Body in Eye   HPI Carlos Contreras is a 60 y.o. male who presents to the emergency department for evaluation of right eye pain. While working [employer] today around [employer], he felt something fly into his eye. He rinsed it with water and almost an entire bottle of Visine without relief. He feels like something is still in it.  Past Medical History:  Diagnosis Date  . Hypertension   . Seizures [facility](HCC)     Patient Active Problem List   Diagnosis Date Noted  . Alcohol dependence (HCC) 05/12/2013  . Cannabis dependence, continuous (HCC) 05/05/2013  . Cocaine dependence with or without physiological dependence (HCC) 05/05/2013    History reviewed. No pertinent surgical history.  Prior to Admission medications   Medication Sig Start Date End Date Taking? Authorizing Provider  ciprofloxacin (CILOXAN) 0.3 % ophthalmic solution Place 1 drop into the right eye every 4 (four) hours while awake. 11/25/15 12/02/15  Chinita Pesterari B Xian Apostol, FNP  cyclobenzaprine (FLEXERIL) 10 MG tablet Take 1 tablet (10 mg total) by mouth 3 (three) times daily as needed for muscle spasms. 11/06/15   Chinita Pesterari B Rayquan Amrhein, FNP  ketorolac (ACULAR) 0.5 % ophthalmic solution Place 1 drop into the right eye 4 (four) times daily. 11/25/15   Chinita Pesterari B Antwone Capozzoli, FNP  lisinopril (PRINIVIL,ZESTRIL) 10 MG tablet Take 10 mg by mouth daily.    Historical Provider, MD  meloxicam (MOBIC) 15 MG tablet Take 1 tablet (15 mg total) by mouth daily. 11/06/15   Chinita Pesterari B Gennifer Potenza, FNP  methocarbamol (ROBAXIN) 500 MG tablet Take 1 tablet (500 mg total) by mouth every 6 (six) hours as needed for muscle spasms. 03/06/15   Evangeline Dakinharles M Beers, PA-C    Allergies Review of patient's allergies indicates no known  allergies.  Family History  Problem Relation Age of Onset  . Alcohol abuse Father   . Alcohol abuse Maternal Uncle   . Alcohol abuse Paternal Grandfather     Social History Social History  Substance Use Topics  . Smoking status: Former Games developermoker  . Smokeless tobacco: Never Used  . Alcohol use No    Review of Systems   Constitutional: No fever/chills Eyes: Positive for visual changes--right eye blurry. Positive for pain. Musculoskeletal: Negative for pain. Skin: Negative for rash. Neurological: Negative for headaches, focal weakness or numbness. ____________________________________________  PHYSICAL EXAM:  VITAL SIGNS: ED Triage Vitals  Enc Vitals Group     BP 11/25/15 2108 (!) 194/100     Pulse Rate 11/25/15 2108 89     Resp 11/25/15 2108 20     Temp 11/25/15 2108 98.3 F (36.8 C)     Temp Source 11/25/15 2108 Oral     SpO2 11/25/15 2108 98 %     Weight 11/25/15 2108 170 lb (77.1 kg)     Height 11/25/15 2108 5\' 6"  (1.676 m)     Head Circumference --      Peak Flow --      Pain Score 11/25/15 2109 10     Pain Loc --      Pain Edu? --      Excl. in GC? --     Constitutional: Alert and oriented. Well appearing and in no acute distress. Eyes: Visual acuity--see nursing documentation; No globe trauma; Eyelids normal to inspection;  Sclera appears anicteric.  Right eyelid was inverted. Conjunctiva appears erythematous and irritated--2 tan flecks of hard substance assumed to be wood were removed with sterile q-tip; Cornea--no abrasions on stain exam. Head: Atraumatic. Nose: No congestion/rhinnorhea. Mouth/Throat: Mucous membranes are moist.  Oropharynx non-erythematous. Respiratory: Respirations even and unlabored. Musculoskeletal:Normal ROM x 4 extremities. Neurologic:  Normal speech and language. No gross focal neurologic deficits are appreciated. Speech is normal. No gait instability. Skin:  Skin is warm, dry and intact. No rash noted. Psychiatric: Mood and affect  are normal. Speech and behavior are normal.  ____________________________________________   LABS (all labs ordered are listed, but only abnormal results are displayed)  Labs Reviewed - No data to display ____________________________________________  EKG   ____________________________________________  RADIOLOGY   ____________________________________________   PROCEDURES  Procedure(s) performed: None ____________________________________________   INITIAL IMPRESSION / ASSESSMENT AND PLAN / ED COURSE  Pertinent labs & imaging results that were available during my care of the patient were reviewed by me and considered in my medical decision making (see chart for details).  Clinical Course    Patient to receive prescriptions for Cipro and Acular drops.  He was advised to follow up with opthalmology for symptoms that are not improving over the next 2 days. He was  also advised to return to the ER for symptoms that change or worsen if unable to schedule an appointment.  ____________________________________________   FINAL CLINICAL IMPRESSION(S) / ED DIAGNOSES  Final diagnoses:  Foreign body, eye, right, initial encounter    Note:  This document was prepared using Dragon voice recognition software and may include unintentional dictation errors.    Chinita Pester, FNP 11/25/15 1610    Phineas Semen, MD 11/25/15 2246

## 2015-11-25 NOTE — ED Triage Notes (Signed)
Pt reports was cutting trees/limbs today and feels like something is in RIGHT eye. Pt states incident occurred about 3 pm today. Pt has hx of HTN, reports did not take medicine tonight.

## 2015-11-25 NOTE — ED Notes (Addendum)
Pt states he was cutting trees and think he got something in his R eye, states it feels like something sticking him. States whole R side of head hurts. States he can't see. Writhing in chair. Denies any problems with L eye.

## 2016-02-29 ENCOUNTER — Other Ambulatory Visit: Payer: Self-pay

## 2016-02-29 ENCOUNTER — Emergency Department
Admission: EM | Admit: 2016-02-29 | Discharge: 2016-03-01 | Disposition: A | Discharge status: Home / Self Care | Payer: Medicare Other | Attending: Emergency Medicine | Admitting: Emergency Medicine

## 2016-02-29 ENCOUNTER — Emergency Department: Payer: Medicare Other

## 2016-02-29 ENCOUNTER — Encounter: Payer: Self-pay | Admitting: Urgent Care

## 2016-02-29 DIAGNOSIS — I10 Essential (primary) hypertension: Secondary | ICD-10-CM | POA: Insufficient documentation

## 2016-02-29 DIAGNOSIS — Z87891 Personal history of nicotine dependence: Secondary | ICD-10-CM | POA: Diagnosis not present

## 2016-02-29 DIAGNOSIS — R079 Chest pain, unspecified: Secondary | ICD-10-CM

## 2016-02-29 LAB — CBC
HCT: 48 % (ref 40.0–52.0)
HEMOGLOBIN: 15.7 g/dL (ref 13.0–18.0)
MCH: 27.7 pg (ref 26.0–34.0)
MCHC: 32.8 g/dL (ref 32.0–36.0)
MCV: 84.5 fL (ref 80.0–100.0)
Platelets: 157 10*3/uL (ref 150–440)
RBC: 5.68 MIL/uL (ref 4.40–5.90)
RDW: 15.2 % — ABNORMAL HIGH (ref 11.5–14.5)
WBC: 4.3 10*3/uL (ref 3.8–10.6)

## 2016-02-29 LAB — BASIC METABOLIC PANEL
ANION GAP: 4 — AB (ref 5–15)
BUN: 18 mg/dL (ref 6–20)
CALCIUM: 8.6 mg/dL — AB (ref 8.9–10.3)
CO2: 24 mmol/L (ref 22–32)
Chloride: 112 mmol/L — ABNORMAL HIGH (ref 101–111)
Creatinine, Ser: 1.19 mg/dL (ref 0.61–1.24)
Glucose, Bld: 149 mg/dL — ABNORMAL HIGH (ref 65–99)
Potassium: 4.2 mmol/L (ref 3.5–5.1)
Sodium: 140 mmol/L (ref 135–145)

## 2016-02-29 LAB — TROPONIN I: Troponin I: <0.03 ng/mL (ref <0.03)

## 2016-02-29 MED ORDER — ASPIRIN 81 MG PO TBEC: 81.0000 mg | DELAYED_RELEASE_TABLET | Freq: Every day | ORAL | 12 refills | Status: DC

## 2016-02-29 NOTE — ED Triage Notes (Signed)
Patient presents with c/o LEFT sided chest pain. He has two episodes today; first at 1500 - symptoms lasted about 15 minutes. Patient reports that he was laying in bed tonight and the symptoms returned; symptoms again, lasted 15 minutes. Of note, patient had elective plasmapheresis today. NAD noted in triage.

## 2016-02-29 NOTE — ED Provider Notes (Signed)
[facility] Emergency Department Provider Note        Time seen: ----------------------------------------- 10:38 PM on 02/29/2016 -----------------------------------------    I have reviewed the triage vital signs and the nursing notes.   HISTORY  Chief Complaint Chest Pain    HPI Carlos Contreras is a 61 y.o. male who presents to ER for left-sided chest pain. Patient describes a sharp, he had 2 episodes around 3:00 and then around 8:00 this evening. He states that last about 15 minutes, he did not have any other associated symptoms. He did have elected plasmapheresis today, is not sure if that is related to his symptoms. He denies recent illness or other complaints. Currently is chest pain-free.   Past Medical History:  Diagnosis Date  . Hypertension   . Seizures [facility](HCC)     Patient Active Problem List   Diagnosis Date Noted  . Alcohol dependence (HCC) 05/12/2013  . Cannabis dependence, continuous (HCC) 05/05/2013  . Cocaine dependence with or without physiological dependence (HCC) 05/05/2013    History reviewed. No pertinent surgical history.  Allergies Patient has no known allergies.  Social History Social History  Substance Use Topics  . Smoking status: Former Games developermoker  . Smokeless tobacco: Never Used  . Alcohol use No    Review of Systems Constitutional: Negative for fever. Cardiovascular: Positive for chest pain Respiratory: Negative for shortness of breath. Gastrointestinal: Negative for abdominal pain, vomiting and diarrhea. Genitourinary: Negative for dysuria. Musculoskeletal: Negative for back pain. Skin: Negative for rash. Neurological: Negative for headaches, focal weakness or numbness.  10-point ROS otherwise negative.  ____________________________________________   PHYSICAL EXAM:  VITAL SIGNS: ED Triage Vitals  Enc Vitals Group     BP 02/29/16 2108 (!) 150/81     Pulse Rate 02/29/16 2108 (!) 57     Resp 02/29/16  2108 18     Temp 02/29/16 2108 98.4 F (36.9 C)     Temp Source 02/29/16 2108 Oral     SpO2 02/29/16 2108 96 %     Weight 02/29/16 2103 170 lb (77.1 kg)     Height 02/29/16 2103 5' 5.5" (1.664 m)     Head Circumference --      Peak Flow --      Pain Score 02/29/16 2103 1     Pain Loc --      Pain Edu? --      Excl. in GC? --     Constitutional: Alert and oriented. Well appearing and in no distress. Eyes: Conjunctivae are normal. PERRL. Normal extraocular movements. ENT   Head: Normocephalic and atraumatic.   Nose: No congestion/rhinnorhea.   Mouth/Throat: Mucous membranes are moist.   Neck: No stridor. Cardiovascular: Normal rate, regular rhythm. No murmurs, rubs, or gallops. Respiratory: Normal respiratory effort without tachypnea nor retractions. Breath sounds are clear and equal bilaterally. No wheezes/rales/rhonchi. Gastrointestinal: Soft and nontender. Normal bowel sounds Musculoskeletal: Nontender with normal range of motion in all extremities. No lower extremity tenderness nor edema. Neurologic:  Normal speech and language. No gross focal neurologic deficits are appreciated.  Skin:  Skin is warm, dry and intact. No rash noted. Psychiatric: Mood and affect are normal. Speech and behavior are normal.  ____________________________________________  EKG: Interpreted by me.Sinus bradycardia with a rate of 50 bpm, normal PR interval, LVH, normal QT.  ____________________________________________  ED COURSE:  Pertinent labs & imaging results that were available during my care of the patient were reviewed by me and considered in my medical decision making (  see chart for details). Clinical Course   Patient presents to ER with nonspecific chest pain. We will assess with labs and imaging.  Procedures ____________________________________________   LABS (pertinent positives/negatives)  Labs Reviewed  BASIC METABOLIC PANEL - Abnormal; Notable for the following:        Result Value   Chloride 112 (*)    Glucose, Bld 149 (*)    Calcium 8.6 (*)    Anion gap 4 (*)    All other components within normal limits  CBC - Abnormal; Notable for the following:    RDW 15.2 (*)    All other components within normal limits  TROPONIN I  TROPONIN I    RADIOLOGY  Chest x-ray is unremarkable  ____________________________________________  FINAL ASSESSMENT AND PLAN  Chest pain  Plan: Patient with labs and imaging as dictated above. Patient is in no acute distress, serial troponins are negative. I will advise baby aspirin and outpatient follow-up with cardiology.   Emily Filbert, MD   Note: This dictation was prepared with Dragon dictation. Any transcriptional errors that result from this process are unintentional    Emily Filbert, MD 02/29/16 [phone]

## 2016-03-02 ENCOUNTER — Telehealth: Payer: Self-pay

## 2016-03-02 NOTE — Telephone Encounter (Signed)
Lmov for patient to call back he was seen in ED for Chest Pain on 02/29/16 Needs to make a follow up with any provider Will try again at a later time

## 2016-03-13 NOTE — Telephone Encounter (Signed)
Pt is coming 03/17/16

## 2016-03-17 ENCOUNTER — Ambulatory Visit: Payer: Self-pay | Admitting: Cardiology

## 2016-03-19 ENCOUNTER — Ambulatory Visit (INDEPENDENT_AMBULATORY_CARE_PROVIDER_SITE_OTHER): Payer: Medicare Other | Admitting: Cardiology

## 2016-03-19 ENCOUNTER — Encounter: Payer: Self-pay | Admitting: Cardiology

## 2016-03-19 VITALS — BP 126/78 | HR 55 | Ht 65.5 in | Wt 171.5 lb

## 2016-03-19 DIAGNOSIS — I10 Essential (primary) hypertension: Secondary | ICD-10-CM | POA: Diagnosis not present

## 2016-03-19 DIAGNOSIS — R079 Chest pain, unspecified: Secondary | ICD-10-CM | POA: Diagnosis not present

## 2016-03-19 DIAGNOSIS — R001 Bradycardia, unspecified: Secondary | ICD-10-CM

## 2016-03-19 NOTE — Progress Notes (Signed)
Cardiology Office Note   Date:  03/19/2016   ID:  Carlos Contreras, DOB September 29, 1955, MRN [id number]  Referring Doctor:  No PCP Per Patient   Cardiologist:   Almond Lint, MD   Reason for consultation:  Chief Complaint  Patient presents with  . other     New Patient ED [facility] for Chest Pain 1/13. Pt states only has had one episode of chest pain since ED visit. Reviewed meds with pt verbally.      History of Present Illness: Carlos Contreras is a 61 y.o. male who presents forChest pain. This has been going on for 3 weeks. He describes it as a sharp pin point pain on the left side of the chest, where he feels her heartbeat. It can be 5 out of 10 in severity, lasting 10 minutes at a time. There is somewhat a pleuritic component to the pain. Not always exertional in nature. Resolving spontaneously after 10 minutes. Maybe there is some shortness of breath as well associated with this.  Patient denies PND, orthopnea, edema. No palpitations no loss of consciousness or dizziness.  He admits to eating a lot of potato chips. And smokes marijuana.   ROS:  Please see the history of present illness. Aside from mentioned under HPI, all other systems are reviewed and negative.     Past Medical History:  Diagnosis Date  . Diabetes mellitus without complication (HCC)    Type II  . Hypertension   . Seizures (HCC)     History reviewed. No pertinent surgical history.   reports that he has quit smoking. He has never used smokeless tobacco. He reports that he uses drugs, including "Crack" cocaine and Marijuana. He reports that he does not drink alcohol.   family history includes Alcohol abuse in his father, maternal uncle, and paternal grandfather; Diabetes in his father, mother, and sister; Heart attack in his father.   Outpatient Medications Prior to Visit  Medication Sig Dispense Refill  . lisinopril (PRINIVIL,ZESTRIL) 10 MG tablet Take 10 mg by mouth daily.    Marland Kitchen aspirin 81 MG EC tablet  Take 1 tablet (81 mg total) by mouth daily. Swallow whole. (Patient not taking: Reported on 03/19/2016) 30 tablet 12  . cyclobenzaprine (FLEXERIL) 10 MG tablet Take 1 tablet (10 mg total) by mouth 3 (three) times daily as needed for muscle spasms. (Patient not taking: Reported on 03/19/2016) 30 tablet 0  . ketorolac (ACULAR) 0.5 % ophthalmic solution Place 1 drop into the right eye 4 (four) times daily. (Patient not taking: Reported on 03/19/2016) 5 mL 0  . meloxicam (MOBIC) 15 MG tablet Take 1 tablet (15 mg total) by mouth daily. (Patient not taking: Reported on 03/19/2016) 30 tablet 0  . methocarbamol (ROBAXIN) 500 MG tablet Take 1 tablet (500 mg total) by mouth every 6 (six) hours as needed for muscle spasms. (Patient not taking: Reported on 03/19/2016) 30 tablet 0   No facility-administered medications prior to visit.      Allergies: Patient has no known allergies.    PHYSICAL EXAM: VS:  BP 126/78 (BP Location: Right Arm, Patient Position: Sitting, Cuff Size: Normal)   Pulse (!) 55   Ht 5' 5.5" (1.664 m)   Wt 171 lb 8 oz (77.8 kg)   BMI 28.11 kg/m  , Body mass index is 28.11 kg/m. Wt Readings from Last 3 Encounters:  03/19/16 171 lb 8 oz (77.8 kg)  02/29/16 170 lb (77.1 kg)  11/25/15 170 lb (77.1  kg)    GENERAL:  well developed, well nourished, not in acute distress HEENT: normocephalic, pink conjunctivae, anicteric sclerae, no xanthelasma, normal dentition, oropharynx clear NECK:  no neck vein engorgement, JVP normal, no hepatojugular reflux, carotid upstroke brisk and symmetric, no bruit, no thyromegaly, no lymphadenopathy LUNGS:  good respiratory effort, clear to auscultation bilaterally CV:  PMI not displaced, no thrills, no lifts, S1 and S2 within normal limits, no palpable S3 or S4, no murmurs, no rubs, no gallops ABD:  Soft, nontender, nondistended, normoactive bowel sounds, no abdominal aortic bruit, no hepatomegaly, no splenomegaly MS: nontender back, no kyphosis, no scoliosis, no  joint deformities EXT:  2+ DP/PT pulses, no edema, no varicosities, no cyanosis, no clubbing SKIN: warm, nondiaphoretic, normal turgor, no ulcers NEUROPSYCH: alert, oriented to person, place, and time, sensory/motor grossly intact, normal mood, appropriate affect  Recent Labs: 02/29/2016: BUN 18; Creatinine, Ser 1.19; Hemoglobin 15.7; Platelets 157; Potassium 4.2; Sodium 140   Lipid Panel    Component Value Date/Time   CHOL 172 11/16/2012 0442   TRIG 97 11/16/2012 0442   HDL 40 11/16/2012 0442   VLDL 19 11/16/2012 0442   LDLCALC 113 (H) 11/16/2012 0442     Other studies Reviewed:  EKG:  The ekg from 03/19/2016 was personally reviewed by me and it revealed sinus bradycardia, 55 BPM.  Additional studies/ records that were reviewed personally reviewed by me today include: None available   ASSESSMENT AND PLAN: Atypical chest pain Recommend echocardiogram Recommend stress echocardiogram for further evaluation  Sinus bradycardia Patient to call our office once he finds out exactly the other blood pressure medication that he takes. If this medication affixed a heart rate, we will adjust the dose. If not, we will consider a Holter monitor.  Hypertension Blood pressure within normal limits. Continue medications. See above. Continue current medical therapy and lifestyle changes.   Current medicines are reviewed at length with the patient today.  The patient does not have concerns regarding medicines.  Labs/ tests ordered today include: No orders of the defined types were placed in this encounter.   I had a lengthy and detailed discussion with the patient regarding diagnoses, prognosis, diagnostic options.  I counseled the patient on importance of lifestyle modification including heart healthy diet, regular physical activity .   Disposition:   FU with undersigned after tests    Thank you for this consultation. We will forwarding this consultation to referring physician.    Signed, Almond LintAileen Hortence Charter, MD  03/19/2016 1:37 PM    [facility]  This note was generated in part with voice recognition software and I apologize for any typographical errors that were not detected and corrected.

## 2016-03-19 NOTE — Patient Instructions (Addendum)
Testing/Procedures: Your physician has requested that you have a stress echocardiogram. For further information please visit [email or web address]. Please follow instruction sheet as given.   Do not drink or eat foods with caffeine for 24 hours before the test. (Chocolate, coffee, tea, or energy drinks)  If you use an inhaler, bring it with you to the test.  Do not smoke for 4 hours before the test.  Wear comfortable shoes and clothing.   Follow-Up: Your physician recommends that you schedule a follow-up appointment after testing with Dr. Alvino ChapelIngal.   It was a pleasure seeing you today here in the office. Please do not hesitate to give us a call back if you have any further questions. [phone]  [place] CellarPamela A. RN, BSN      Exercise Stress Echocardiogram An exercise stress echocardiogram is a test that checks how well your heart is working. For this test, you will walk on a treadmill to make your heart beat faster. This test uses sound waves (ultrasound) and a computer to make pictures (images) of your heart. These pictures will be taken before you exercise and after you exercise. What happens before the procedure?  Follow instructions from your doctor about what you cannot eat or drink before the test.  Do not drink or eat anything that has caffeine in it. Stop having caffeine for 24 hours before the test.  Ask your doctor about changing or stopping your normal medicines. This is important if you take diabetes medicines or blood thinners. Ask your doctor if you should take your medicines with water before the test.  If you use an inhaler, bring it to the test.  Do not use any products that have nicotine or tobacco in them, such as cigarettes and e-cigarettes. Stop using them for 4 hours before the test. If you need help quitting, ask your doctor.  Wear comfortable shoes and clothing. What happens during the procedure?  You will be hooked up to a TV screen. Your doctor will watch the  screen to see how fast your heart beats during the test.  Before you exercise, a computer will make a picture of your heart. To do this:  A gel will be put on your chest.  A wand will be moved over the gel.  Sound waves from the wand will go to the computer to make the picture.  Your will start walking on a treadmill. The treadmill will start at a slow speed. It will get faster a little bit at a time. When you walk faster, your heart will beat faster.  The treadmill will be stopped when your heart is working hard.  You will lie down right away so another picture of your heart can be taken.  The test will take 30-60 minutes. What happens after the procedure?  Your heart rate and blood pressure will be watched after the test.  If your doctor says that you can, you may:  Eat what you usually eat.  Do your normal activities.  Take medicines like normal. Summary  An exercise stress echocardiogram is a test that checks how well your heart is working.  Follow instructions about what you cannot eat or drink before the test. Ask your doctor if you should take your normal medicines before the test.  Stop having caffeine for 24 hours before the test. Do not use anything with nicotine or tobacco in it for 4 hours before the test.  A computer will take a picture of your heart before you walk on  a treadmill. It will take another picture when you are done walking.  Your heart rate and blood pressure will be watched after the test. This information is not intended to replace advice given to you by your health care provider. Make sure you discuss any questions you have with your health care provider. Document Released: 11/30/2008 Document Revised: 10/27/2015 Document Reviewed: 10/27/2015 Elsevier Interactive Patient Education  2017 [employer].

## 2016-04-15 ENCOUNTER — Other Ambulatory Visit: Payer: Self-pay

## 2016-04-22 ENCOUNTER — Ambulatory Visit: Payer: Self-pay | Admitting: Cardiology

## 2016-04-30 ENCOUNTER — Ambulatory Visit (INDEPENDENT_AMBULATORY_CARE_PROVIDER_SITE_OTHER): Payer: Medicare Other

## 2016-04-30 ENCOUNTER — Telehealth: Payer: Self-pay | Admitting: *Deleted

## 2016-04-30 DIAGNOSIS — R079 Chest pain, unspecified: Secondary | ICD-10-CM

## 2016-04-30 LAB — ECHOCARDIOGRAM STRESS TEST
CSEPEDS: 33 s
CSEPEW: 7.8 METS
CSEPPHR: 122 {beats}/min
Exercise duration (min): 6 min
MPHR: 162 {beats}/min
Percent HR: 75 %
Rest HR: 95 {beats}/min

## 2016-04-30 NOTE — Telephone Encounter (Signed)
-----   Message from Almond LintAileen Ingal, MD sent at 04/30/2016  3:07 PM EDT ----- Submaximal test, rec lexiscan stress test

## 2016-04-30 NOTE — Telephone Encounter (Signed)
Spoke with patient and he wants me to call back in the morning to schedule his stress test because he is giving blood right now. Let him know that I would call back.

## 2016-05-01 NOTE — Telephone Encounter (Signed)
Left voicemail message to call back  

## 2016-05-01 NOTE — Telephone Encounter (Signed)
Left voicemail message to call back to schedule stress test.  

## 2016-05-01 NOTE — Telephone Encounter (Signed)
Spoke with patient to schedule stress test and scheduled for next week. Reviewed all instructions with him and he verbalized understanding with no further questions at this time.

## 2016-05-05 ENCOUNTER — Encounter
Admission: RE | Admit: 2016-05-05 | Discharge: 2016-05-05 | Disposition: A | Discharge status: Home / Self Care | Payer: Medicare Other | Source: Ambulatory Visit | Attending: Cardiology | Admitting: Cardiology

## 2016-05-05 DIAGNOSIS — R079 Chest pain, unspecified: Secondary | ICD-10-CM | POA: Diagnosis not present

## 2016-05-05 LAB — NM MYOCAR MULTI W/SPECT W/WALL MOTION / EF
CSEPHR: 59 %
LV dias vol: 112 mL (ref 62–150)
LVSYSVOL: 46 mL
NUC STRESS TID: 1.14
Peak HR: 96 {beats}/min
Rest HR: 50 {beats}/min

## 2016-05-05 MED ORDER — REGADENOSON 0.4 MG/5ML IV SOLN
0.4000 mg | Freq: Once | INTRAVENOUS | Status: AC
  Administered 2016-05-05: 0.4 mg via INTRAVENOUS

## 2016-05-05 MED ORDER — TECHNETIUM TC 99M TETROFOSMIN IV KIT
12.1700 | PACK | Freq: Once | INTRAVENOUS | Status: AC | PRN
  Administered 2016-05-05: 12.17 via INTRAVENOUS

## 2016-05-05 MED ORDER — TECHNETIUM TC 99M TETROFOSMIN IV KIT
31.6170 | PACK | Freq: Once | INTRAVENOUS | Status: AC | PRN
  Administered 2016-05-05: 31.617 via INTRAVENOUS

## 2016-05-06 ENCOUNTER — Ambulatory Visit (INDEPENDENT_AMBULATORY_CARE_PROVIDER_SITE_OTHER): Payer: Medicare Other | Admitting: Cardiology

## 2016-05-06 ENCOUNTER — Encounter: Payer: Self-pay | Admitting: Cardiology

## 2016-05-06 ENCOUNTER — Telehealth: Payer: Self-pay | Admitting: Cardiology

## 2016-05-06 VITALS — BP 130/94 | HR 51 | Ht 65.5 in | Wt 174.5 lb

## 2016-05-06 DIAGNOSIS — R001 Bradycardia, unspecified: Secondary | ICD-10-CM | POA: Diagnosis not present

## 2016-05-06 DIAGNOSIS — I10 Essential (primary) hypertension: Secondary | ICD-10-CM

## 2016-05-06 NOTE — Telephone Encounter (Signed)
Spoke with patient and he confirmed his current medications that he is taking. Lamotrigine 100 mg once a day, Amlodipine 5 mg once a day, Lisinopril 20 mg once a day. Let him know that I would pass this on to Dr. Alvino Chapel and would be in touch if we need to make any changes. He was appreciative for the call and had no further questions at this time.

## 2016-05-06 NOTE — Telephone Encounter (Signed)
Left voicemail message to call back  

## 2016-05-06 NOTE — Telephone Encounter (Signed)
Patient calling back to report which medicines he is taking

## 2016-05-06 NOTE — Patient Instructions (Addendum)
Testing/Procedures: Your physician has recommended that you wear a holter monitor. Holter monitors are medical devices that record the heart's electrical activity. Doctors most often use these monitors to diagnose arrhythmias. Arrhythmias are problems with the speed or rhythm of the heartbeat. The monitor is a small, portable device. You can wear one while you do your normal daily activities. This is usually used to diagnose what is causing palpitations/syncope (passing out).  Your physician has requested that you have an echocardiogram. Echocardiography is a painless test that uses sound waves to create images of your heart. It provides your doctor with information about the size and shape of your heart and how well your heart's chambers and valves are working. This procedure takes approximately one hour. There are no restrictions for this procedure.    Follow-Up: Your physician recommends that you schedule a follow-up appointment as needed. We will call you with results and if needed schedule follow up at that time.   It was a pleasure seeing you today here in the office. Please do not hesitate to give us a call back if you have any further questions. [phone]  [place] CellarPamela A. RN, BSN    Echocardiogram An echocardiogram, or echocardiography, uses sound waves (ultrasound) to produce an image of your heart. The echocardiogram is simple, painless, obtained within a short period of time, and offers valuable information to your health care provider. The images from an echocardiogram can provide information such as:  Evidence of coronary artery disease (CAD).  Heart size.  Heart muscle function.  Heart valve function.  Aneurysm detection.  Evidence of a past heart attack.  Fluid buildup around the heart.  Heart muscle thickening.  Assess heart valve function. Tell a health care provider about:  Any allergies you have.  All medicines you are taking, including vitamins, herbs, eye  drops, creams, and over-the-counter medicines.  Any problems you or family members have had with anesthetic medicines.  Any blood disorders you have.  Any surgeries you have had.  Any medical conditions you have.  Whether you are pregnant or may be pregnant. What happens before the procedure? No special preparation is needed. Eat and drink normally. What happens during the procedure?  In order to produce an image of your heart, gel will be applied to your chest and a wand-like tool (transducer) will be moved over your chest. The gel will help transmit the sound waves from the transducer. The sound waves will harmlessly bounce off your heart to allow the heart images to be captured in real-time motion. These images will then be recorded.  You may need an IV to receive a medicine that improves the quality of the pictures. What happens after the procedure? You may return to your normal schedule including diet, activities, and medicines, unless your health care provider tells you otherwise. This information is not intended to replace advice given to you by your health care provider. Make sure you discuss any questions you have with your health care provider. Document Released: 01/31/2000 Document Revised: 09/21/2015 Document Reviewed: 10/10/2012 Elsevier Interactive Patient Education  2017 [employer] Inc.  Holter Monitoring A Holter monitor is a small device that is used to detect abnormal heart rhythms. It clips to your clothing and is connected by wires to flat, sticky disks (electrodes) that attach to your chest. It is worn continuously for 24-48 hours. Follow these instructions at home:  Wear your Holter monitor at all times, even while exercising and sleeping, for as long as directed by your  health care provider.  Make sure that the Holter monitor is safely clipped to your clothing or close to your body as recommended by your health care provider.  Do not get the monitor or wires  wet.  Do not put body lotion or moisturizer on your chest.  Keep your skin clean.  Keep a diary of your daily activities, such as walking and doing chores. If you feel that your heartbeat is abnormal or that your heart is fluttering or skipping a beat:  Record what you are doing when it happens.  Record what time of day the symptoms occur.  Return your Holter monitor as directed by your health care provider.  Keep all follow-up visits as directed by your health care provider. This is important. Get help right away if:  You feel lightheaded or you faint.  You have trouble breathing.  You feel pain in your chest, upper arm, or jaw.  You feel sick to your stomach and your skin is pale, cool, or damp.  You heartbeat feels unusual or abnormal. This information is not intended to replace advice given to you by your health care provider. Make sure you discuss any questions you have with your health care provider. Document Released: 11/01/2003 Document Revised: 07/11/2015 Document Reviewed: 09/11/2013 Elsevier Interactive Patient Education  2017 [employer].

## 2016-05-06 NOTE — Progress Notes (Signed)
Cardiology Office Note   Date:  05/06/2016   ID:  Carlos Contreras, Carlos Contreras 12/24/1955, MRN [id number]  Referring Doctor:  [facility]   Cardiologist:   Almond Lint, MD   Reason for consultation:  Chief Complaint  Patient presents with  . other    F/u stress echo fu. Pt states he is doing well. Pt states he is unable to afford his seizure medication.Reviewed meds with pt verbally.      History of Present Illness: Carlos Contreras is a 62 y.o. male who presents for Follow-up after testing.  Patient has had no recurrence of chest pain. He mentioned that he had issues with seizure medication. He reports that he only gets the seizures during sleep. We talked about going back to his family doctor or his neurologist regarding evaluation of that, I mentioned possibly his sleep study can be done.  Regarding blood pressure medications, he still does not remember the other is usually takes for blood pressure control.  No passing out. No shortness of breath.  ROS:  Please see the history of present illness. Aside from mentioned under HPI, all other systems are reviewed and negative.    Past Medical History:  Diagnosis Date  . Diabetes mellitus without complication (HCC)    Type II  . Hypertension   . Seizures (HCC)     History reviewed. No pertinent surgical history.   reports that he has quit smoking. He has never used smokeless tobacco. He reports that he uses drugs, including "Crack" cocaine, Marijuana, and Cocaine. He reports that he does not drink alcohol.   family history includes Alcohol abuse in his father, maternal uncle, and paternal grandfather; Diabetes in his father, mother, and sister; Heart attack in his father.   Outpatient Medications Prior to Visit  Medication Sig Dispense Refill  . ibuprofen (ADVIL,MOTRIN) 200 MG tablet Take 200 mg by mouth every 6 (six) hours as needed.    Marland Kitchen lisinopril (PRINIVIL,ZESTRIL) 10 MG tablet Take 10 mg by mouth daily.     No  facility-administered medications prior to visit.      Allergies: Patient has no known allergies.    PHYSICAL EXAM: VS:  BP (!) 130/94 (BP Location: Left Arm, Patient Position: Sitting, Cuff Size: Normal)   Pulse (!) 51   Ht 5' 5.5" (1.664 m)   Wt 174 lb 8 oz (79.2 kg)   BMI 28.60 kg/m  , Body mass index is 28.6 kg/m. Wt Readings from Last 3 Encounters:  05/06/16 174 lb 8 oz (79.2 kg)  03/19/16 171 lb 8 oz (77.8 kg)  02/29/16 170 lb (77.1 kg)  GENERAL:  well developed, well nourished, not in acute distress HEENT: normocephalic, pink conjunctivae, anicteric sclerae, no xanthelasma, normal dentition, oropharynx clear NECK:  no neck vein engorgement, JVP normal, no hepatojugular reflux, carotid upstroke brisk and symmetric, no bruit, no thyromegaly, no lymphadenopathy LUNGS:  good respiratory effort, clear to auscultation bilaterally CV:  PMI not displaced, no thrills, no lifts, S1 and S2 within normal limits, no palpable S3 or S4, no murmurs, no rubs, no gallops ABD:  Soft, nontender, nondistended, normoactive bowel sounds, no abdominal aortic bruit, no hepatomegaly, no splenomegaly MS: nontender back, no kyphosis, no scoliosis, no joint deformities EXT:  2+ DP/PT pulses, no edema, no varicosities, no cyanosis, no clubbing SKIN: warm, nondiaphoretic, normal turgor, no ulcers NEUROPSYCH: alert, oriented to person, place, and time, sensory/motor grossly intact, normal mood, appropriate affect   Recent Labs: 02/29/2016: BUN 18;  Creatinine, Ser 1.19; Hemoglobin 15.7; Platelets 157; Potassium 4.2; Sodium 140   Lipid Panel    Component Value Date/Time   CHOL 172 11/16/2012 0442   TRIG 97 11/16/2012 0442   HDL 40 11/16/2012 0442   VLDL 19 11/16/2012 0442   LDLCALC 113 (H) 11/16/2012 0442     Other studies Reviewed:  EKG:  The ekg from 03/19/2016 was personally reviewed by me and it revealed sinus bradycardia, 55 BPM.  Additional studies/ records that were reviewed personally  reviewed by me today include:  Nuclear stress test 05/05/2016:  Low risk study without evidence of ischemia.  There is a small in size, mild in severity, fixed defect involving the basal and mid inferoseptal segments, which most likely represents artifact and less likely scar.  The left ventricular ejection fraction is normal (55-65%).  ASSESSMENT AND PLAN: Atypical chest pain - no recurrence  stress echo was submaximal,  Stress test was ordered. That did not reveal any significant ischemia. EF was normal. Discussed this with patient at length. Verbalized understanding. Likelihood of significant CAD is very low. Patient reassured.  Sinus bradycardia Again, patient encouraged to let our office know which other blood pressure medication he takes. If it is not to rate control drug, we will then proceed with echo and Holter monitor.  Hypertension BP is well controlled. Continue monitoring BP. Continue current medical therapy and lifestyle changes.  Current medicines are reviewed at length with the patient today.  The patient does not have concerns regarding medicines.  Labs/ tests ordered today include:  Orders Placed This Encounter  Procedures  . Holter monitor - 24 hour  . ECHOCARDIOGRAM COMPLETE    I had a lengthy and detailed discussion with the patient regarding diagnoses, prognosis, diagnostic options.  I counseled the patient on importance of lifestyle modification including heart healthy diet, regular physical activity .   Disposition:   FU with With cardiology after tests, when necessary.    Signed, Almond LintAileen Anuradha Chabot, MD  05/06/2016 1:40 PM    [facility]  This note was generated in part with voice recognition software and I apologize for any typographical errors that were not detected and corrected.

## 2016-05-07 NOTE — Telephone Encounter (Signed)
As mentioned in the office visit note, since he is not on a rate control antihypertensive medication, we should schedule him for a Holter. Also an echocardiogram. Thank you.

## 2016-06-11 ENCOUNTER — Other Ambulatory Visit: Payer: Self-pay

## 2016-06-15 ENCOUNTER — Other Ambulatory Visit: Payer: Self-pay

## 2016-06-15 ENCOUNTER — Ambulatory Visit (INDEPENDENT_AMBULATORY_CARE_PROVIDER_SITE_OTHER): Payer: Medicare Other

## 2016-06-15 DIAGNOSIS — R001 Bradycardia, unspecified: Secondary | ICD-10-CM

## 2016-06-24 ENCOUNTER — Encounter: Payer: Self-pay | Admitting: *Deleted

## 2016-06-24 ENCOUNTER — Telehealth: Payer: Self-pay | Admitting: *Deleted

## 2016-06-24 NOTE — Telephone Encounter (Signed)
Attempted to call on 4 different occasions and unable to reach patient to review results and no DPR on file. Will mail letter for patient to call back to review echocardiogram results and reminder for upcoming appointment on 06/29/16 at 08:30 AM.

## 2016-06-24 NOTE — Telephone Encounter (Signed)
-----   Message from Almond LintAileen Ingal, MD sent at 06/16/2016  3:26 PM EDT ----- lvef ok

## 2016-06-25 NOTE — Telephone Encounter (Signed)
Patient returned my calls regarding results. Reviewed results of testing in detail and that he has upcoming appointment as well. He verbalized understanding with no further questions at this time.

## 2017-03-16 IMAGING — CR DG CHEST 2V
2 series · 2 of 2 positions shown · non-contrast
Comparison: 02/03/2013

CLINICAL DATA: Right-sided rib pain

EXAM:
CHEST  2 VIEW

[chest pa]
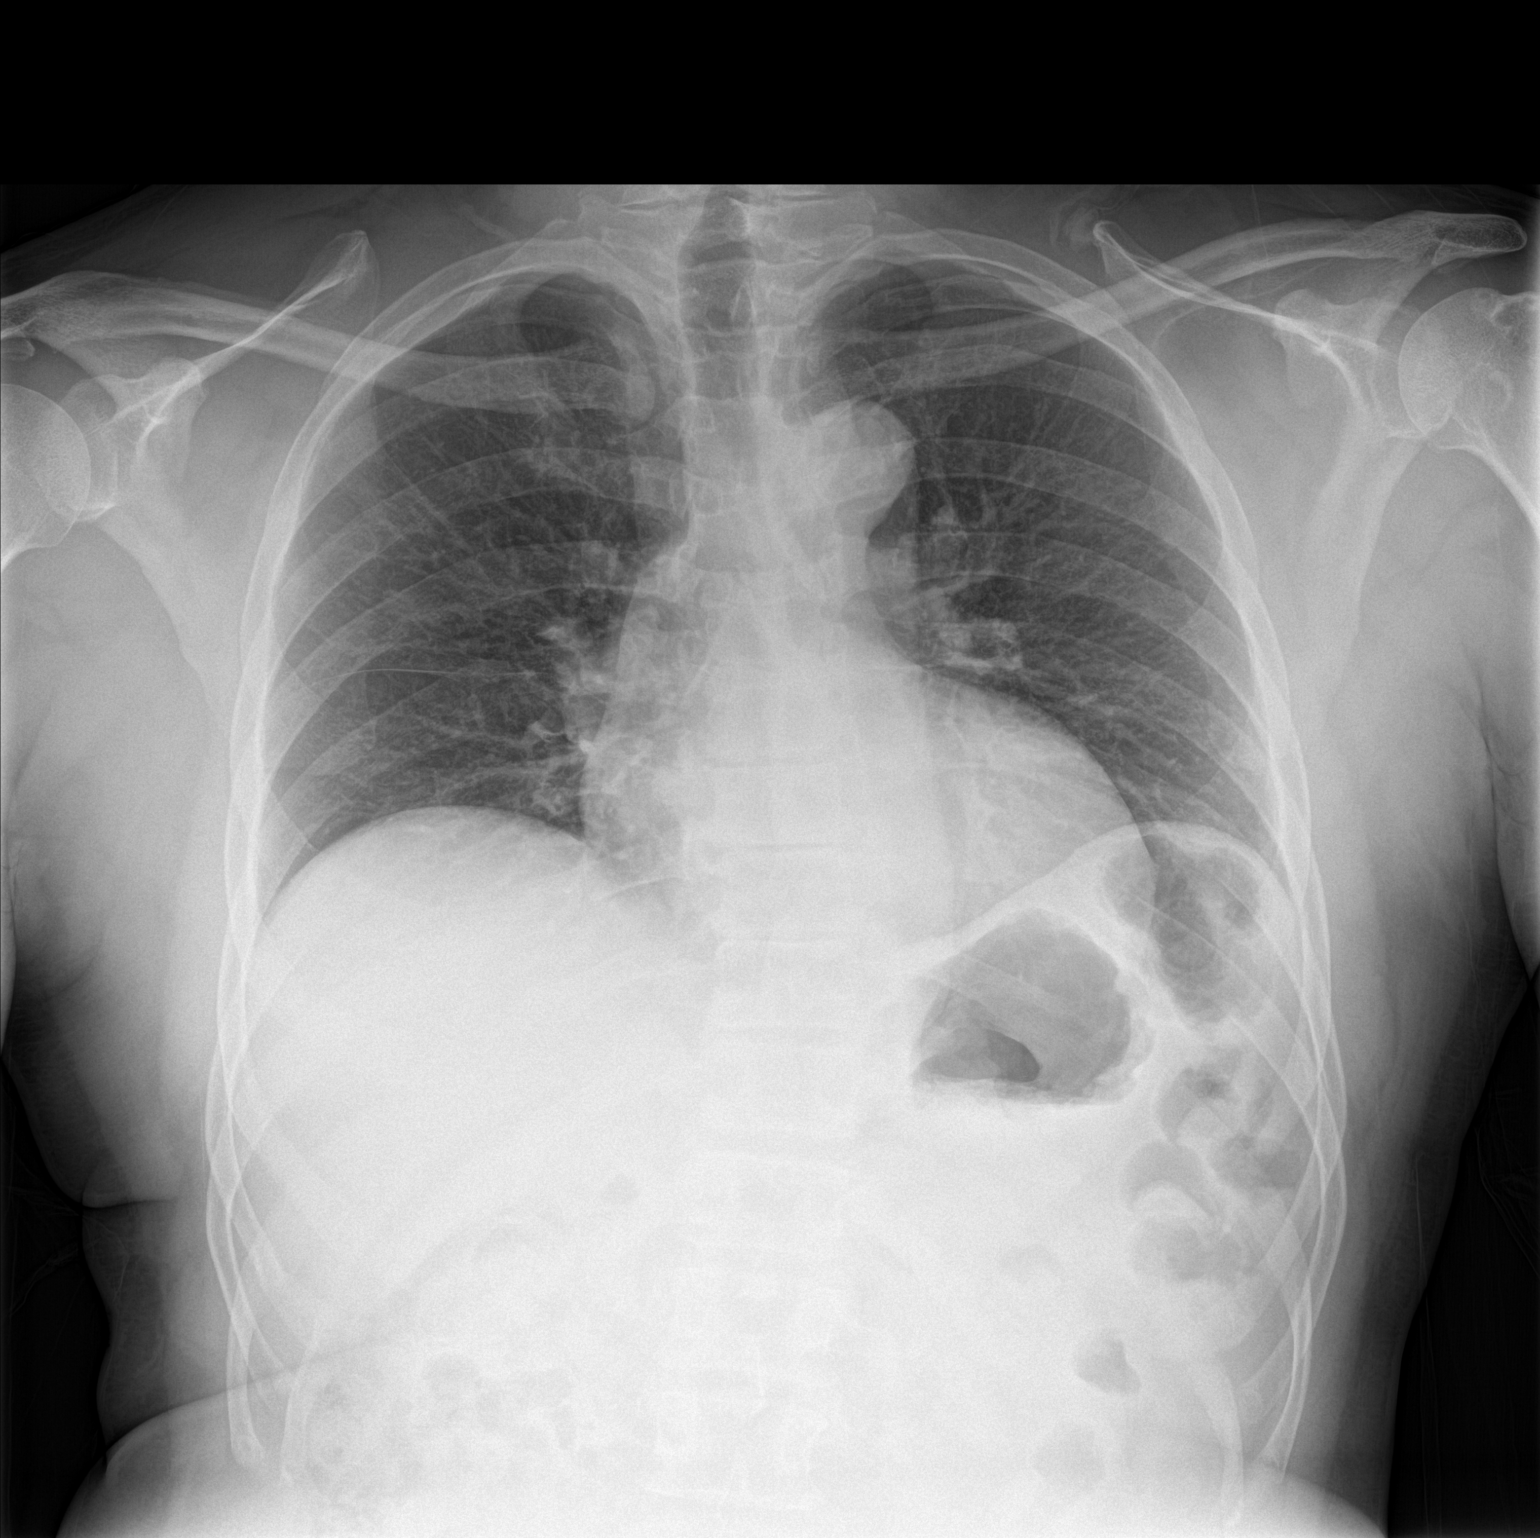

[chest lat]
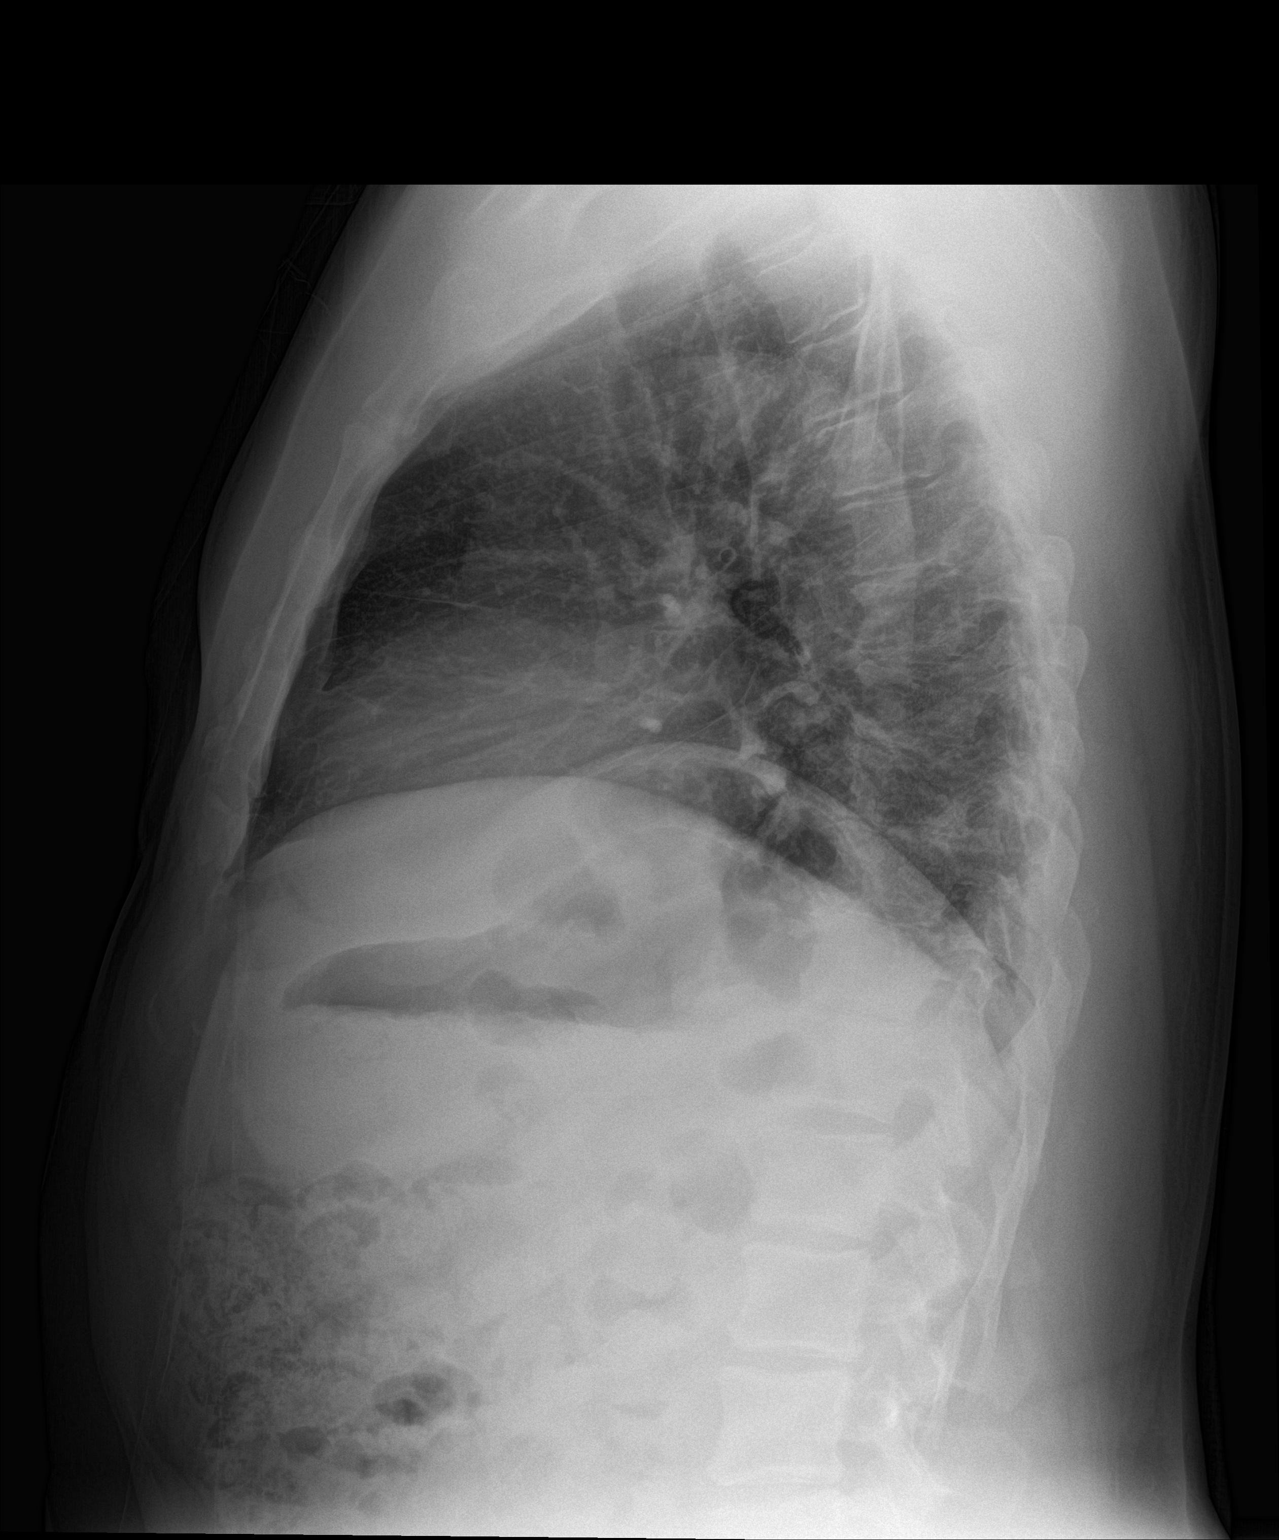

[2 of 2 positions shown; findings below may reference images not displayed]

FINDINGS: Normal heart size and mediastinal contours. Low volumes. No acute
infiltrate or edema. No effusion or pneumothorax. No acute osseous
findings.
IMPRESSION: No acute finding.  No visible fracture.

## 2017-04-05 ENCOUNTER — Encounter: Payer: Self-pay | Admitting: Emergency Medicine

## 2017-04-05 ENCOUNTER — Emergency Department
Admission: EM | Admit: 2017-04-05 | Discharge: 2017-04-05 | Disposition: A | Discharge status: Home / Self Care | Payer: Medicare Other | Attending: Emergency Medicine | Admitting: Emergency Medicine

## 2017-04-05 ENCOUNTER — Other Ambulatory Visit: Payer: Self-pay

## 2017-04-05 DIAGNOSIS — E119 Type 2 diabetes mellitus without complications: Secondary | ICD-10-CM | POA: Insufficient documentation

## 2017-04-05 DIAGNOSIS — Z87891 Personal history of nicotine dependence: Secondary | ICD-10-CM | POA: Diagnosis not present

## 2017-04-05 DIAGNOSIS — R55 Syncope and collapse: Secondary | ICD-10-CM | POA: Diagnosis not present

## 2017-04-05 DIAGNOSIS — I10 Essential (primary) hypertension: Secondary | ICD-10-CM | POA: Diagnosis not present

## 2017-04-05 DIAGNOSIS — Z79899 Other long term (current) drug therapy: Secondary | ICD-10-CM | POA: Diagnosis not present

## 2017-04-05 LAB — BASIC METABOLIC PANEL
ANION GAP: 6 (ref 5–15)
BUN: 15 mg/dL (ref 6–20)
CO2: 28 mmol/L (ref 22–32)
Calcium: 8.6 mg/dL — ABNORMAL LOW (ref 8.9–10.3)
Chloride: 106 mmol/L (ref 101–111)
Creatinine, Ser: 1.39 mg/dL — ABNORMAL HIGH (ref 0.61–1.24)
GFR calc Af Amer: >60 mL/min (ref >60)
GFR calc non Af Amer: 54 mL/min — ABNORMAL LOW (ref >60)
GLUCOSE: 168 mg/dL — AB (ref 65–99)
POTASSIUM: 4.1 mmol/L (ref 3.5–5.1)
Sodium: 140 mmol/L (ref 135–145)

## 2017-04-05 LAB — CBC
HEMATOCRIT: 54.5 % — AB (ref 40.0–52.0)
HEMOGLOBIN: 18 g/dL (ref 13.0–18.0)
MCH: 27.2 pg (ref 26.0–34.0)
MCHC: 33 g/dL (ref 32.0–36.0)
MCV: 82.5 fL (ref 80.0–100.0)
Platelets: 251 10*3/uL (ref 150–440)
RBC: 6.6 MIL/uL — AB (ref 4.40–5.90)
RDW: 14.9 % — ABNORMAL HIGH (ref 11.5–14.5)
WBC: 6.4 10*3/uL (ref 3.8–10.6)

## 2017-04-05 LAB — URINALYSIS, COMPLETE (UACMP) WITH MICROSCOPIC
BACTERIA UA: NONE SEEN
BILIRUBIN URINE: NEGATIVE
GLUCOSE, UA: NEGATIVE mg/dL
Hgb urine dipstick: NEGATIVE
KETONES UR: NEGATIVE mg/dL
Leukocytes, UA: NEGATIVE
NITRITE: NEGATIVE
PROTEIN: NEGATIVE mg/dL
Specific Gravity, Urine: 1.011 (ref 1.005–1.030)
Squamous Epithelial / LPF: NONE SEEN
pH: 6 (ref 5.0–8.0)

## 2017-04-05 NOTE — ED Notes (Signed)
Pt given water, a meal tray and apple sauce per MD verbal order. Pt sitting in bed feeding self without difficulty.

## 2017-04-05 NOTE — Discharge Instructions (Signed)
Return to the ER for new, worsening, persistent dizziness, lightheadedness, weakness, feeling like you are going to pass out, or any other new or worsening symptoms that concern you.

## 2017-04-05 NOTE — ED Notes (Signed)
Orthostatic at scene

## 2017-04-05 NOTE — ED Triage Notes (Addendum)
Pt to ed with c/o weakness and syncopy today while giving plasma. Brought to ed via ems. Pt states he had not eaten since early am and that it was taking an extended amount of time for them to get the plasma.  Pt alert and oriented at this time.  Pt reports "I feel bad"  Blood drawn and ekg done.  Pt tolerated well.  Skin warm and dry.

## 2017-04-05 NOTE — ED Provider Notes (Signed)
[facility] Emergency Department Provider Note ____________________________________________   First MD Initiated Contact with Patient 04/05/17 1705     (approximate)  I have reviewed the triage vital signs and the nursing notes.   HISTORY  Chief Complaint Weakness    HPI Carlos Contreras is a 62 y.o. male with past medical history as noted below who presents with syncope, acute onset this afternoon, preceded by feeling lightheaded and weak, and occurring after he had given plasma.  Patient states that he was feeling at his baseline prior to giving the plasma, but felt weaker and more lightheaded throughout the procedure.  He states afterwards he got up felt dizzy, and passed out.  He states he now just feels tired.  He states he had only eaten a small amount early this morning and that he sometimes feels a bit weak and dizzy after giving the plasma but this time was worse likely due to not having eaten.  Patient denies any other acute complaints.  Past Medical History:  Diagnosis Date  . Diabetes mellitus without complication (HCC)    Type II  . Hypertension   . Seizures [facility](HCC)     Patient Active Problem List   Diagnosis Date Noted  . Alcohol dependence (HCC) 05/12/2013  . Cannabis dependence, continuous (HCC) 05/05/2013  . Cocaine dependence with or without physiological dependence (HCC) 05/05/2013    History reviewed. No pertinent surgical history.  Prior to Admission medications   Medication Sig Start Date End Date Taking? Authorizing Provider  amLODipine (NORVASC) 10 MG tablet Take 10 mg by mouth daily.    [provider]  ibuprofen (ADVIL,MOTRIN) 200 MG tablet Take 200 mg by mouth every 6 (six) hours as needed.    [provider]  lamoTRIgine (LAMICTAL) 100 MG tablet Take 100 mg by mouth daily.    [provider]  lisinopril (PRINIVIL,ZESTRIL) 20 MG tablet Take 20 mg by mouth daily.    [provider]     Allergies Patient has no known allergies.  Family History  Problem Relation Age of Onset  . Alcohol abuse Father   . Diabetes Father   . Heart attack Father   . Alcohol abuse Maternal Uncle   . Alcohol abuse Paternal Grandfather   . Diabetes Mother   . Diabetes Sister     Social History Social History   Tobacco Use  . Smoking status: Former Games developermoker  . Smokeless tobacco: Never Used  Substance Use Topics  . Alcohol use: No  . Drug use: Yes    Types: "Crack" cocaine, Marijuana, Cocaine    Review of Systems  Constitutional: No fever. Eyes: No visual changes. ENT: No neck pain. Cardiovascular: Denies chest pain. Respiratory: Denies shortness of breath. Gastrointestinal: No nausea, no vomiting.   Genitourinary: Negative for flank pain.  Musculoskeletal: Negative for back pain. Skin: Negative for abrasions or lacerations. Neurological: Negative for headache.   ____________________________________________   PHYSICAL EXAM:  VITAL SIGNS: ED Triage Vitals  Enc Vitals Group     BP 04/05/17 1447 109/87     Pulse Rate 04/05/17 1447 62     Resp 04/05/17 1447 16     Temp 04/05/17 1447 98 F (36.7 C)     Temp Source 04/05/17 1447 Oral     SpO2 04/05/17 1447 100 %     Weight 04/05/17 1448 184 lb (83.5 kg)     Height --      Head Circumference --  Peak Flow --      Pain Score 04/05/17 1447 0     Pain Loc --      Pain Edu? --      Excl. in GC? --     Constitutional: Alert and oriented. Well appearing and in no acute distress. Eyes: Conjunctivae are normal.  EOMI.  PERRLA. Head: Atraumatic. Nose: No congestion/rhinnorhea. Mouth/Throat: Mucous membranes are dry.   Neck: Normal range of motion.  Cardiovascular: Normal rate, regular rhythm. Grossly normal heart sounds.  Good peripheral circulation. Respiratory: Normal respiratory effort.  No retractions. Lungs CTAB. Gastrointestinal: Soft and nontender. No distention.  Genitourinary: No flank  tenderness. Musculoskeletal: No lower extremity edema.  Extremities warm and well perfused.  Neurologic:  Normal speech and language.  Motor and sensory intact in all extremities.  No gross focal neurologic deficits are appreciated.  Skin:  Skin is warm and dry. No rash noted. Psychiatric: Mood and affect are normal. Speech and behavior are normal.  ____________________________________________   LABS (all labs ordered are listed, but only abnormal results are displayed)  Labs Reviewed  BASIC METABOLIC PANEL - Abnormal; Notable for the following components:      Result Value   Glucose, Bld 168 (*)    Creatinine, Ser 1.39 (*)    Calcium 8.6 (*)    GFR calc non Af Amer 54 (*)    All other components within normal limits  CBC - Abnormal; Notable for the following components:   RBC 6.60 (*)    HCT 54.5 (*)    RDW 14.9 (*)    All other components within normal limits  URINALYSIS, COMPLETE (UACMP) WITH MICROSCOPIC - Abnormal; Notable for the following components:   Color, Urine YELLOW (*)    APPearance CLEAR (*)    All other components within normal limits  CBG MONITORING, ED   ____________________________________________  EKG  ED ECG REPORT I, Dionne Bucy, the attending physician, personally viewed and interpreted this ECG.  Date: 04/05/2017 EKG Time: 1454 Rate: 56 Rhythm: normal sinus rhythm QRS Axis: normal Intervals: normal ST/T Wave abnormalities: Voltage criteria for LVH Narrative Interpretation: no evidence of acute ischemia or arrhythmia  ____________________________________________  RADIOLOGY    ____________________________________________   PROCEDURES  Procedure(s) performed: No  Procedures  Critical Care performed: No ____________________________________________   INITIAL IMPRESSION / ASSESSMENT AND PLAN / ED COURSE  Pertinent labs & imaging results that were available during my care of the patient were reviewed by me and considered in my  medical decision making (see chart for details).  62 year old male with past medical history as noted above presents with syncope after feeling weak and lightheaded while giving plasma.  I reviewed the past medical records in epic but I am unable to ascertain exactly the reason the patient is giving plasma.  Patient states that he ate less than he usually does today.  On exam, vitals are normal (patient states he is always somewhat bradycardic), neuro exam is nonfocal, and the remainder the exam is unremarkable.  Patient is relatively well-appearing.  He has dry mucous membranes and appears somewhat dehydrated.    Differential for the presentation is most likely vasovagal syncope from volume loss, dehydration, or and/or decreased p.o. intake.  No evidence of cardiac cause.  EKG is unremarkable.  Lab workup is unremarkable except for elevated blood counts which appear to be baseline for patient.  UA is negative.  Plan: P.o. hydration and food, and reassess.  If patient is asymptomatic anticipate discharge home.  -----------------------------------------  6:10 PM on 04/05/2017 -----------------------------------------  Patient is up, walking around, and asymptomatic.  He would like to go home.  Return precautions given, and the patient expresses understanding.      ____________________________________________   FINAL CLINICAL IMPRESSION(S) / ED DIAGNOSES  Final diagnoses:  Syncope, vasovagal      NEW MEDICATIONS STARTED DURING THIS VISIT:  New Prescriptions   No medications on file     Note:  This document was prepared using Dragon voice recognition software and may include unintentional dictation errors.    Dionne Bucy, MD 04/05/17 [phone]

## 2018-10-09 ENCOUNTER — Emergency Department
Admission: EM | Admit: 2018-10-09 | Discharge: 2018-10-09 | Disposition: A | Discharge status: Home / Self Care | Payer: Medicare Other | Attending: Emergency Medicine | Admitting: Emergency Medicine

## 2018-10-09 ENCOUNTER — Other Ambulatory Visit: Payer: Self-pay

## 2018-10-09 ENCOUNTER — Encounter: Payer: Self-pay | Admitting: Emergency Medicine

## 2018-10-09 DIAGNOSIS — Y929 Unspecified place or not applicable: Secondary | ICD-10-CM | POA: Insufficient documentation

## 2018-10-09 DIAGNOSIS — Y999 Unspecified external cause status: Secondary | ICD-10-CM | POA: Insufficient documentation

## 2018-10-09 DIAGNOSIS — S0591XA Unspecified injury of right eye and orbit, initial encounter: Secondary | ICD-10-CM | POA: Diagnosis present

## 2018-10-09 DIAGNOSIS — X58XXXA Exposure to other specified factors, initial encounter: Secondary | ICD-10-CM | POA: Diagnosis not present

## 2018-10-09 DIAGNOSIS — E119 Type 2 diabetes mellitus without complications: Secondary | ICD-10-CM | POA: Diagnosis not present

## 2018-10-09 DIAGNOSIS — Z79899 Other long term (current) drug therapy: Secondary | ICD-10-CM | POA: Diagnosis not present

## 2018-10-09 DIAGNOSIS — I10 Essential (primary) hypertension: Secondary | ICD-10-CM | POA: Diagnosis not present

## 2018-10-09 DIAGNOSIS — Y939 Activity, unspecified: Secondary | ICD-10-CM | POA: Insufficient documentation

## 2018-10-09 DIAGNOSIS — S0501XA Injury of conjunctiva and corneal abrasion without foreign body, right eye, initial encounter: Secondary | ICD-10-CM | POA: Diagnosis not present

## 2018-10-09 DIAGNOSIS — Z87891 Personal history of nicotine dependence: Secondary | ICD-10-CM | POA: Insufficient documentation

## 2018-10-09 HISTORY — DX: Unspecified iridocyclitis: H20.9

## 2018-10-09 MED ORDER — ERYTHROMYCIN 5 MG/GM OP OINT: 1.0000 "application " | TOPICAL_OINTMENT | Freq: Three times a day (TID) | OPHTHALMIC | 0 refills | Status: DC

## 2018-10-09 MED ORDER — FLUORESCEIN SODIUM 1 MG OP STRP
ORAL_STRIP | OPHTHALMIC | Status: AC
  Administered 2018-10-09: 01:00:00
  Filled 2018-10-09: qty 1

## 2018-10-09 MED ORDER — KETOROLAC TROMETHAMINE 0.5 % OP SOLN: 1.0000 [drp] | Freq: Three times a day (TID) | OPHTHALMIC | 0 refills | Status: DC | PRN

## 2018-10-09 MED ORDER — ERYTHROMYCIN 5 MG/GM OP OINT
TOPICAL_OINTMENT | Freq: Once | OPHTHALMIC | Status: AC
  Administered 2018-10-09: 1 via OPHTHALMIC
  Filled 2018-10-09: qty 1

## 2018-10-09 NOTE — ED Provider Notes (Signed)
[facility] Emergency Department Provider Note   ____________________________________________    I have reviewed the triage vital signs and the nursing notes.   HISTORY  Chief Complaint Right eye pain    HPI Carlos Contreras is a 63 y.o. male with history of diabetes presents with complaint of pain in his right eye x2 days.  Patient reports 3 days ago he felt something in his eye and was able to rub it out but he developed pain in that eye the next day.  No fevers or chills.  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  Reports the pain is better when he keeps his eye closed  Past Medical History:  Diagnosis Date  . Diabetes mellitus without complication ([place])    Type II  . Hypertension   . Iritis    both eyes  . Seizures [facility])     Patient Active Problem List   Diagnosis Date Noted  . Alcohol dependence ([place]) 05/12/2013  . Cannabis dependence, continuous ([place]) 05/05/2013  . Cocaine dependence with or without physiological dependence ([place]) 05/05/2013    History reviewed. No pertinent surgical history.  Prior to Admission medications   Medication Sig Start Date End Date Taking? Authorizing Provider  amLODipine (NORVASC) 10 MG tablet Take 10 mg by mouth daily.    [provider]  erythromycin ophthalmic ointment Place 1 application into the right eye 3 (three) times daily. 10/09/18   Lavonia Drafts, MD  ibuprofen (ADVIL,MOTRIN) 200 MG tablet Take 200 mg by mouth every 6 (six) hours as needed.    [provider]  ketorolac (ACULAR) 0.5 % ophthalmic solution Place 1 drop into the right eye 3 (three) times daily as needed. 10/09/18   Lavonia Drafts, MD  lamoTRIgine (LAMICTAL) 100 MG tablet Take 100 mg by mouth daily.    [provider]  lisinopril (PRINIVIL,ZESTRIL) 20 MG tablet Take 20 mg by mouth daily.    [provider]     Allergies Patient has no known allergies.  Family History  Problem Relation Age of Onset  .  Alcohol abuse Father   . Diabetes Father   . Heart attack Father   . Alcohol abuse Maternal Uncle   . Alcohol abuse Paternal Grandfather   . Diabetes Mother   . Diabetes Sister     Social History Social History   Tobacco Use  . Smoking status: Former Research scientist (life sciences)  . Smokeless tobacco: Never Used  Substance Use Topics  . Alcohol use: No  . Drug use: Not Currently    Types: "Crack" cocaine, Marijuana, Cocaine    Review of Systems  Constitutional: No fever/chills     Gastrointestinal:   No nausea, no vomiting.    Skin: Negative for rash. Neurological: Negative for headaches     ____________________________________________   PHYSICAL EXAM:  VITAL SIGNS: ED Triage Vitals  Enc Vitals Group     BP 10/09/18 0008 (!) 165/89     Pulse Rate 10/09/18 0008 (!) 48     Resp 10/09/18 0008 17     Temp 10/09/18 0008 98.6 F (37 C)     Temp Source 10/09/18 0008 Oral     SpO2 10/09/18 0008 100 %     Weight 10/09/18 0010 79.3 kg (174 lb 13.2 oz)     Height 10/09/18 0010 1.676 m (5\' 6" )     Head Circumference --      Peak Flow --      Pain Score 10/09/18 0009 10  Pain Loc --      Pain Edu? --      Excl. in GC? --    Constitutional: Alert and oriented.  Eyes: Right eye: After tetracaine patient able to open eye more comfortably.  No hyphema no abnormality of the pupil, no steamy cornea, PERRLA.  Fluorescein stain: Positive uptake 3:00 consistent with corneal abrasion Head: Atraumatic. Nose: No congestion/rhinnorhea. Mouth/Throat: Mucous membranes are moist.   Cardiovascular: Normal rate, regular rhythm.  Respiratory: Normal respiratory effort.  No retractions.   Neurologic:  Normal speech and language. No gross focal neurologic deficits are appreciated.   Skin:  Skin is warm, dry and intact. No rash noted.   ____________________________________________   LABS (all labs ordered are listed, but only abnormal results are displayed)  Labs Reviewed - No data to display  ____________________________________________  EKG   ____________________________________________  RADIOLOGY  None ____________________________________________   PROCEDURES  Procedure(s) performed: No  Procedures   Critical Care performed: No ____________________________________________   INITIAL IMPRESSION / ASSESSMENT AND PLAN / ED COURSE  Pertinent labs & imaging results that were available during my care of the patient were reviewed by me and considered in my medical decision making (see chart for details).  Patient presents with right eye pain as above.  No pain in the temple.  Exam consistent with corneal abrasion via fluorescein stain/Woods lamp at 3:00, will treat with Acular, erythromycin ointment, close follow-up with orthopedics   ____________________________________________   FINAL CLINICAL IMPRESSION(S) / ED DIAGNOSES  Final diagnoses:  Abrasion of right cornea, initial encounter      NEW MEDICATIONS STARTED DURING THIS VISIT:  Discharge Medication List as of 10/09/2018  1:08 AM    START taking these medications   Details  erythromycin ophthalmic ointment Place 1 application into the right eye 3 (three) times daily., Starting Sun 10/09/2018, Print    ketorolac (ACULAR) 0.5 % ophthalmic solution Place 1 drop into the right eye 3 (three) times daily as needed., Starting Sun 10/09/2018, Print         Note:  This document was prepared using Dragon voice recognition software and may include unintentional dictation errors.   Jene EveryKinner, Rayshon Albaugh, MD 10/09/18 [phone]

## 2018-10-09 NOTE — ED Triage Notes (Signed)
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

## 2019-01-02 ENCOUNTER — Ambulatory Visit
Admission: RE | Admit: 2019-01-02 | Discharge: 2019-01-02 | Disposition: A | Discharge status: Home / Self Care | Payer: Medicare Other | Attending: Ophthalmology | Admitting: Ophthalmology

## 2019-01-02 ENCOUNTER — Other Ambulatory Visit: Payer: Self-pay | Admitting: Ophthalmology

## 2019-01-02 ENCOUNTER — Other Ambulatory Visit
Admission: RE | Admit: 2019-01-02 | Discharge: 2019-01-02 | Disposition: A | Discharge status: Home / Self Care | Payer: Medicare Other | Source: Home / Self Care | Attending: Ophthalmology | Admitting: Ophthalmology

## 2019-01-02 ENCOUNTER — Ambulatory Visit
Admission: RE | Admit: 2019-01-02 | Discharge: 2019-01-02 | Disposition: A | Discharge status: Home / Self Care | Payer: Medicare Other | Source: Ambulatory Visit | Attending: Ophthalmology | Admitting: Ophthalmology

## 2019-01-02 DIAGNOSIS — R001 Bradycardia, unspecified: Secondary | ICD-10-CM | POA: Diagnosis present

## 2019-01-02 DIAGNOSIS — D869 Sarcoidosis, unspecified: Secondary | ICD-10-CM

## 2019-01-02 LAB — CBC WITH DIFFERENTIAL/PLATELET
Abs Immature Granulocytes: 0.01 10*3/uL (ref 0.00–0.07)
Basophils Absolute: 0 10*3/uL (ref 0.0–0.1)
Basophils Relative: 1 %
Eosinophils Absolute: 0.1 10*3/uL (ref 0.0–0.5)
Eosinophils Relative: 1 %
HCT: 44 % (ref 39.0–52.0)
Hemoglobin: 14.2 g/dL (ref 13.0–17.0)
Immature Granulocytes: 0 %
Lymphocytes Relative: 44 %
Lymphs Abs: 2.5 10*3/uL (ref 0.7–4.0)
MCH: 26.8 pg (ref 26.0–34.0)
MCHC: 32.3 g/dL (ref 30.0–36.0)
MCV: 83 fL (ref 80.0–100.0)
Monocytes Absolute: 0.4 10*3/uL (ref 0.1–1.0)
Monocytes Relative: 7 %
Neutro Abs: 2.7 10*3/uL (ref 1.7–7.7)
Neutrophils Relative %: 47 %
Platelets: 259 10*3/uL (ref 150–400)
RBC: 5.3 MIL/uL (ref 4.22–5.81)
RDW: 14.1 % (ref 11.5–15.5)
WBC: 5.6 10*3/uL (ref 4.0–10.5)
nRBC: 0 % (ref 0.0–0.2)

## 2019-01-02 LAB — ALKALINE PHOSPHATASE: Alkaline Phosphatase: 63 U/L (ref 38–126)

## 2019-01-02 LAB — SEDIMENTATION RATE: Sed Rate: 7 mm/hr (ref 0–20)

## 2019-01-03 LAB — B. BURGDORFI ANTIBODIES: B burgdorferi Ab IgG+IgM: <0.91 {ISR} (ref 0.00–0.90)

## 2019-01-04 LAB — QUANTIFERON-TB GOLD PLUS (RQFGPL)
QuantiFERON Mitogen Value: >10 IU/mL
QuantiFERON Nil Value: 0.09 IU/mL
QuantiFERON TB1 Ag Value: 0.06 IU/mL
QuantiFERON TB2 Ag Value: 0.09 IU/mL

## 2019-01-04 LAB — QUANTIFERON-TB GOLD PLUS: QuantiFERON-TB Gold Plus: NEGATIVE

## 2019-12-24 ENCOUNTER — Emergency Department
Admission: EM | Admit: 2019-12-24 | Discharge: 2019-12-24 | Disposition: A | Discharge status: Home / Self Care | Payer: Medicare Other | Attending: Emergency Medicine | Admitting: Emergency Medicine

## 2019-12-24 ENCOUNTER — Emergency Department: Payer: Medicare Other

## 2019-12-24 DIAGNOSIS — R519 Headache, unspecified: Secondary | ICD-10-CM | POA: Insufficient documentation

## 2019-12-24 DIAGNOSIS — Z79899 Other long term (current) drug therapy: Secondary | ICD-10-CM | POA: Insufficient documentation

## 2019-12-24 DIAGNOSIS — Z87891 Personal history of nicotine dependence: Secondary | ICD-10-CM | POA: Diagnosis not present

## 2019-12-24 DIAGNOSIS — I10 Essential (primary) hypertension: Secondary | ICD-10-CM | POA: Diagnosis not present

## 2019-12-24 DIAGNOSIS — J069 Acute upper respiratory infection, unspecified: Secondary | ICD-10-CM

## 2019-12-24 DIAGNOSIS — Z20822 Contact with and (suspected) exposure to covid-19: Secondary | ICD-10-CM | POA: Diagnosis not present

## 2019-12-24 DIAGNOSIS — R0981 Nasal congestion: Secondary | ICD-10-CM | POA: Diagnosis present

## 2019-12-24 DIAGNOSIS — E119 Type 2 diabetes mellitus without complications: Secondary | ICD-10-CM | POA: Insufficient documentation

## 2019-12-24 LAB — COMPREHENSIVE METABOLIC PANEL
ALT: 17 U/L (ref 0–44)
AST: 26 U/L (ref 15–41)
Albumin: 3.6 g/dL (ref 3.5–5.0)
Alkaline Phosphatase: 67 U/L (ref 38–126)
Anion gap: 7 (ref 5–15)
BUN: 15 mg/dL (ref 8–23)
CO2: 27 mmol/L (ref 22–32)
Calcium: 9 mg/dL (ref 8.9–10.3)
Chloride: 105 mmol/L (ref 98–111)
Creatinine, Ser: 1.39 mg/dL — ABNORMAL HIGH (ref 0.61–1.24)
GFR, Estimated: 57 mL/min — ABNORMAL LOW (ref >60)
Glucose, Bld: 86 mg/dL (ref 70–99)
Potassium: 3.6 mmol/L (ref 3.5–5.1)
Sodium: 139 mmol/L (ref 135–145)
Total Bilirubin: 0.8 mg/dL (ref 0.3–1.2)
Total Protein: 7.3 g/dL (ref 6.5–8.1)

## 2019-12-24 LAB — RESPIRATORY PANEL BY RT PCR (FLU A&B, COVID)
Influenza A by PCR: NEGATIVE
Influenza B by PCR: NEGATIVE
SARS Coronavirus 2 by RT PCR: NEGATIVE

## 2019-12-24 LAB — CBC
HCT: 44.3 % (ref 39.0–52.0)
Hemoglobin: 14.4 g/dL (ref 13.0–17.0)
MCH: 27.1 pg (ref 26.0–34.0)
MCHC: 32.5 g/dL (ref 30.0–36.0)
MCV: 83.4 fL (ref 80.0–100.0)
Platelets: 236 10*3/uL (ref 150–400)
RBC: 5.31 MIL/uL (ref 4.22–5.81)
RDW: 14.3 % (ref 11.5–15.5)
WBC: 4.9 10*3/uL (ref 4.0–10.5)
nRBC: 0 % (ref 0.0–0.2)

## 2019-12-24 LAB — TROPONIN I (HIGH SENSITIVITY): Troponin I (High Sensitivity): 9 ng/L (ref <18)

## 2019-12-24 MED ORDER — BENZONATATE 100 MG PO CAPS: 100.0000 mg | ORAL_CAPSULE | Freq: Three times a day (TID) | ORAL | 0 refills | Status: DC | PRN

## 2019-12-24 NOTE — ED Notes (Signed)
See triage note- pt here after a known exposure to covid and is exhibiting symptoms such as cough, headaches, and stuffy nose

## 2019-12-24 NOTE — ED Triage Notes (Signed)
Patient to ED for symptoms of covid and a known covid exposure. Has stuffy nose, cough, headache, and body aches. Denies fever at home. Admits to history of pneumonia.

## 2019-12-24 NOTE — ED Provider Notes (Signed)
[facility] Emergency Department Provider Note  ____________________________________________  Time seen: Approximately 11:38 AM  I have reviewed the triage vital signs and the nursing notes.   HISTORY  Chief Complaint Covid Exposure    HPI Carlos Contreras is a 64 y.o. male that presents to the emergency department for evaluation of headache, nasal congestion, sore throat, productive cough with clear phlegm, chest discomfort only with coughing for 3 days.  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  His symptoms developed on Thursday morning.  He has been Covid vaccinated.  No fever, shortness of breath, nausea, vomiting, abdominal pain, diarrhea.  Past Medical History:  Diagnosis Date  . Diabetes mellitus without complication (HCC)    Type II  . Hypertension   . Iritis    both eyes  . Seizures [facility])     Patient Active Problem List   Diagnosis Date Noted  . Alcohol dependence (HCC) 05/12/2013  . Cannabis dependence, continuous (HCC) 05/05/2013  . Cocaine dependence with or without physiological dependence (HCC) 05/05/2013    History reviewed. No pertinent surgical history.  Prior to Admission medications   Medication Sig Start Date End Date Taking? Authorizing Provider  amLODipine (NORVASC) 10 MG tablet Take 10 mg by mouth daily.    [provider]  benzonatate (TESSALON PERLES) 100 MG capsule Take 1 capsule (100 mg total) by mouth 3 (three) times daily as needed. 12/24/19 12/23/20  Enid Derry, PA-C  erythromycin ophthalmic ointment Place 1 application into the right eye 3 (three) times daily. 10/09/18   Jene Every, MD  ibuprofen (ADVIL,MOTRIN) 200 MG tablet Take 200 mg by mouth every 6 (six) hours as needed.    [provider]  ketorolac (ACULAR) 0.5 % ophthalmic solution Place 1 drop into the right eye 3 (three) times daily as  needed. 10/09/18   Jene Every, MD  lamoTRIgine (LAMICTAL) 100 MG tablet Take 100 mg by mouth daily.    [provider]  lisinopril (PRINIVIL,ZESTRIL) 20 MG tablet Take 20 mg by mouth daily.    [provider]    Allergies Patient has no known allergies.  Family History  Problem Relation Age of Onset  . Alcohol abuse Father   . Diabetes Father   . Heart attack Father   . Alcohol abuse Maternal Uncle   . Alcohol abuse Paternal Grandfather   . Diabetes Mother   . Diabetes Sister     Social History Social History   Tobacco Use  . Smoking status: Former Games developer  . Smokeless tobacco: Never Used  Substance Use Topics  . Alcohol use: No  . Drug use: Not Currently    Types: "Crack" cocaine, Marijuana, Cocaine     Review of Systems  Constitutional: No fever/chills Eyes: No visual changes. No discharge. ENT: Positive for congestion and rhinorrhea.  Positive for sore throat. Cardiovascular: Positive for chest discomfort with cough. Respiratory: Positive for cough. No SOB. Gastrointestinal: No abdominal pain.  No nausea, no vomiting.  No diarrhea.  No constipation. Musculoskeletal: Negative for musculoskeletal pain. Skin: Negative for rash, abrasions, lacerations, ecchymosis. Neurological: Positive for headache.   ____________________________________________   PHYSICAL EXAM:  VITAL SIGNS: ED Triage Vitals [12/24/19 1055]  Enc Vitals Group     BP      Pulse      Resp      Temp      Temp  src      SpO2      Weight 180 lb (81.6 kg)     Height 5\' 6"  (1.676 m)     Head Circumference      Peak Flow      Pain Score 5     Pain Loc      Pain Edu?      Excl. in GC?      Constitutional: Alert and oriented. Well appearing and in no acute distress. Eyes: Conjunctivae are normal. PERRL. EOMI. No discharge. Head: Atraumatic. ENT: No frontal and maxillary sinus tenderness.      Ears: Tympanic membranes pearly gray with good landmarks. No discharge.       Nose: Mild congestion/rhinnorhea.      Mouth/Throat: Mucous membranes are moist. Oropharynx non-erythematous. Tonsils not enlarged. No exudates. Uvula midline. Neck: No stridor.   Hematological/Lymphatic/Immunilogical: No cervical lymphadenopathy. Cardiovascular: Normal rate, regular rhythm.  Good peripheral circulation. Respiratory: Actively coughing.  Normal respiratory effort without tachypnea or retractions. Lungs CTAB. Good air entry to the bases with no decreased or absent breath sounds. Gastrointestinal: Bowel sounds 4 quadrants. Soft and nontender to palpation. No guarding or rigidity. No palpable masses. No distention. Musculoskeletal: Full range of motion to all extremities. No gross deformities appreciated. Neurologic:  Normal speech and language. No gross focal neurologic deficits are appreciated.  Skin:  Skin is warm, dry and intact. No rash noted. Psychiatric: Mood and affect are normal. Speech and behavior are normal. Patient exhibits appropriate insight and judgement.   ____________________________________________   LABS (all labs ordered are listed, but only abnormal results are displayed)  Labs Reviewed  COMPREHENSIVE METABOLIC PANEL - Abnormal; Notable for the following components:      Result Value   Creatinine, Ser 1.39 (*)    GFR, Estimated 57 (*)    All other components within normal limits  RESPIRATORY PANEL BY RT PCR (FLU A&B, COVID)  CBC  TROPONIN I (HIGH SENSITIVITY)  TROPONIN I (HIGH SENSITIVITY)   ____________________________________________  EKG   ____________________________________________  RADIOLOGY , personally viewed and evaluated these images (plain radiographs) as part of my medical decision making, as well as reviewing the written report by the radiologist.  DG Chest 1 View  Result Date: 12/24/2019 CLINICAL DATA:  Cough, former smoker EXAM: CHEST  1 VIEW COMPARISON:  01/02/2019 chest radiograph. FINDINGS: Stable  cardiomediastinal silhouette with normal heart size. No pneumothorax. No pleural effusion. Lungs appear clear, with no acute consolidative airspace disease and no pulmonary edema. IMPRESSION: No active disease. Electronically Signed   By: 01/04/2019 M.D.   On: 12/24/2019 12:07    ____________________________________________    PROCEDURES  Procedure(s) performed:    Procedures    Medications - No data to display   ____________________________________________   INITIAL IMPRESSION / ASSESSMENT AND PLAN / ED COURSE  Pertinent labs & imaging results that were available during my care of the patient were reviewed by me and considered in my medical decision making (see chart for details).  Review of the [place] CSRS was performed in accordance of the [facility] prior to dispensing any controlled drugs.     Patient's diagnosis is consistent with viral URI. Vital signs and exam are reassuring.  Chest x-ray negative for acute cardiopulmonary processes.  Lab work is largely unremarkable.  Covid and influenza tests are negative.  Patient appears well and is staying well hydrated. Patient feels comfortable going home. Patient will be discharged home with prescriptions for [facility]. Patient  is to follow up with primary care as needed or otherwise directed. Patient is given ED precautions to return to the ED for any worsening or new symptoms.  ORLIE CUNDARI was evaluated in Emergency Department on 12/24/2019 for the symptoms described in the history of present illness. He was evaluated in the context of the global COVID-19 pandemic, which necessitated consideration that the patient might be at risk for infection with the SARS-CoV-2 virus that causes COVID-19. Institutional protocols and algorithms that pertain to the evaluation of patients at risk for COVID-19 are in a state of rapid change based on information released by regulatory bodies including the CDC and federal and state organizations. These  policies and algorithms were followed during the patient's care in the ED.   ____________________________________________  FINAL CLINICAL IMPRESSION(S) / ED DIAGNOSES  Final diagnoses:  Upper respiratory tract infection, unspecified type      NEW MEDICATIONS STARTED DURING THIS VISIT:  ED Discharge Orders         Ordered    benzonatate (TESSALON PERLES) 100 MG capsule  3 times daily PRN        12/24/19 1452              This chart was dictated using voice recognition software/Dragon. Despite best efforts to proofread, errors can occur which can change the meaning. Any change was purely unintentional.    Enid Derry, PA-C 12/24/19 1840    Gilles Chiquito, MD 12/25/19 [phone]

## 2020-06-15 ENCOUNTER — Other Ambulatory Visit: Payer: Self-pay

## 2020-06-15 ENCOUNTER — Emergency Department
Admission: EM | Admit: 2020-06-15 | Discharge: 2020-06-15 | Disposition: A | Discharge status: Home / Self Care | Payer: Medicare (Managed Care) | Attending: Emergency Medicine | Admitting: Emergency Medicine

## 2020-06-15 ENCOUNTER — Emergency Department: Payer: Medicare (Managed Care)

## 2020-06-15 DIAGNOSIS — Z79899 Other long term (current) drug therapy: Secondary | ICD-10-CM | POA: Insufficient documentation

## 2020-06-15 DIAGNOSIS — E119 Type 2 diabetes mellitus without complications: Secondary | ICD-10-CM | POA: Diagnosis not present

## 2020-06-15 DIAGNOSIS — G510 Bell's palsy: Secondary | ICD-10-CM | POA: Diagnosis not present

## 2020-06-15 DIAGNOSIS — Z87891 Personal history of nicotine dependence: Secondary | ICD-10-CM | POA: Insufficient documentation

## 2020-06-15 DIAGNOSIS — R29810 Facial weakness: Secondary | ICD-10-CM | POA: Diagnosis present

## 2020-06-15 DIAGNOSIS — I10 Essential (primary) hypertension: Secondary | ICD-10-CM | POA: Diagnosis not present

## 2020-06-15 LAB — DIFFERENTIAL
Abs Immature Granulocytes: 0 10*3/uL (ref 0.00–0.07)
Basophils Absolute: 0 10*3/uL (ref 0.0–0.1)
Basophils Relative: 1 %
Eosinophils Absolute: 0.1 10*3/uL (ref 0.0–0.5)
Eosinophils Relative: 2 %
Immature Granulocytes: 0 %
Lymphocytes Relative: 61 %
Lymphs Abs: 2.4 10*3/uL (ref 0.7–4.0)
Monocytes Absolute: 0.4 10*3/uL (ref 0.1–1.0)
Monocytes Relative: 11 %
Neutro Abs: 1 10*3/uL — ABNORMAL LOW (ref 1.7–7.7)
Neutrophils Relative %: 25 %

## 2020-06-15 LAB — COMPREHENSIVE METABOLIC PANEL
ALT: 19 U/L (ref 0–44)
AST: 26 U/L (ref 15–41)
Albumin: 4.1 g/dL (ref 3.5–5.0)
Alkaline Phosphatase: 70 U/L (ref 38–126)
Anion gap: 10 (ref 5–15)
BUN: 16 mg/dL (ref 8–23)
CO2: 27 mmol/L (ref 22–32)
Calcium: 9.6 mg/dL (ref 8.9–10.3)
Chloride: 103 mmol/L (ref 98–111)
Creatinine, Ser: 1.25 mg/dL — ABNORMAL HIGH (ref 0.61–1.24)
GFR, Estimated: >60 mL/min (ref >60)
Glucose, Bld: 99 mg/dL (ref 70–99)
Potassium: 3.6 mmol/L (ref 3.5–5.1)
Sodium: 140 mmol/L (ref 135–145)
Total Bilirubin: 0.9 mg/dL (ref 0.3–1.2)
Total Protein: 7.6 g/dL (ref 6.5–8.1)

## 2020-06-15 LAB — CBC
HCT: 43 % (ref 39.0–52.0)
Hemoglobin: 14.2 g/dL (ref 13.0–17.0)
MCH: 26.7 pg (ref 26.0–34.0)
MCHC: 33 g/dL (ref 30.0–36.0)
MCV: 81 fL (ref 80.0–100.0)
Platelets: 270 10*3/uL (ref 150–400)
RBC: 5.31 MIL/uL (ref 4.22–5.81)
RDW: 14.2 % (ref 11.5–15.5)
WBC: 3.9 10*3/uL — ABNORMAL LOW (ref 4.0–10.5)
nRBC: 0 % (ref 0.0–0.2)

## 2020-06-15 LAB — PROTIME-INR
INR: 0.9 (ref 0.8–1.2)
Prothrombin Time: 12.6 seconds (ref 11.4–15.2)

## 2020-06-15 LAB — APTT: aPTT: 29 seconds (ref 24–36)

## 2020-06-15 MED ORDER — PREDNISONE 20 MG PO TABS: 40.0000 mg | ORAL_TABLET | Freq: Every day | ORAL | 0 refills | Status: AC

## 2020-06-15 MED ORDER — LUBRICATING EYE DROPS 0.4-0.3 % OP SOLN: OPHTHALMIC | 0 refills | Status: DC

## 2020-06-15 MED ORDER — VALACYCLOVIR HCL 1 G PO TABS: 1000.0000 mg | ORAL_TABLET | Freq: Three times a day (TID) | ORAL | 0 refills | Status: DC

## 2020-06-15 NOTE — ED Notes (Signed)
Patient transported to MRI 

## 2020-06-15 NOTE — ED Provider Notes (Signed)
[facility] Emergency Department Provider Note  Time seen: 5:02 PM  I have reviewed the triage vital signs and the nursing notes.   HISTORY  Chief Complaint Facial Droop   HPI Carlos Contreras is a 65 y.o. male with a past medical history of diabetes, hypertension, presents emergency department for left facial droop.  According to the patient since yesterday afternoon he has noticed a left facial droop as well as numbness to the left face.  States he noticed some difficulty speaking today which he thinks is due to the left facial weakness.  States he is also not been able to close his eye completely on the left side today.  Patient states a family member had Bell's palsy previously and he wanted to see if he had Bell's palsy as well.  Patient denies any fever cough congestion shortness of breath weakness or numbness of any arm or leg confusion slurred speech.  Does state mild headache.   Past Medical History:  Diagnosis Date  . Diabetes mellitus without complication (HCC)    Type II  . Hypertension   . Iritis    both eyes  . Seizures [facility])     Patient Active Problem List   Diagnosis Date Noted  . Alcohol dependence (HCC) 05/12/2013  . Cannabis dependence, continuous (HCC) 05/05/2013  . Cocaine dependence with or without physiological dependence (HCC) 05/05/2013    No past surgical history on file.  Prior to Admission medications   Medication Sig Start Date End Date Taking? Authorizing Provider  amLODipine (NORVASC) 10 MG tablet Take 10 mg by mouth daily.    [provider]  benzonatate (TESSALON PERLES) 100 MG capsule Take 1 capsule (100 mg total) by mouth 3 (three) times daily as needed. 12/24/19 12/23/20  Enid Derry, PA-C  erythromycin ophthalmic ointment Place 1 application into the right eye 3 (three) times daily. 10/09/18   Jene Every, MD  ibuprofen (ADVIL,MOTRIN) 200 MG tablet Take 200 mg by mouth every 6 (six) hours as needed.     [provider]  ketorolac (ACULAR) 0.5 % ophthalmic solution Place 1 drop into the right eye 3 (three) times daily as needed. 10/09/18   Jene Every, MD  lamoTRIgine (LAMICTAL) 100 MG tablet Take 100 mg by mouth daily.    [provider]  lisinopril (PRINIVIL,ZESTRIL) 20 MG tablet Take 20 mg by mouth daily.    [provider]    No Known Allergies  Family History  Problem Relation Age of Onset  . Alcohol abuse Father   . Diabetes Father   . Heart attack Father   . Alcohol abuse Maternal Uncle   . Alcohol abuse Paternal Grandfather   . Diabetes Mother   . Diabetes Sister     Social History Social History   Tobacco Use  . Smoking status: Former Games developer  . Smokeless tobacco: Never Used  Substance Use Topics  . Alcohol use: No  . Drug use: Not Currently    Types: "Crack" cocaine, Marijuana, Cocaine    Review of Systems Constitutional: Negative for fever. Cardiovascular: Negative for chest pain. Respiratory: Negative for shortness of breath. Gastrointestinal: Negative for abdominal pain Musculoskeletal: Negative for musculoskeletal complaints Neurological: Mild headache.  Moderate left facial droop.  Numbness to left face. All other ROS negative  ____________________________________________   PHYSICAL EXAM:  VITAL SIGNS: ED Triage Vitals [06/15/20 1445]  Enc Vitals Group     BP (!) 142/92     Pulse Rate (!) 52  Resp 20     Temp 98.4 F (36.9 C)     Temp Source Oral     SpO2 97 %     Weight 175 lb (79.4 kg)     Height 5\' 6"  (1.676 m)     Head Circumference      Peak Flow      Pain Score 0     Pain Loc      Pain Edu?      Excl. in GC?    Constitutional: Alert and oriented. Well appearing and in no distress. Eyes: Normal exam ENT      Head: Normocephalic and atraumatic.      Mouth/Throat: Mucous membranes are moist. Cardiovascular: Normal rate, regular rhythm.  Respiratory: Normal respiratory effort without tachypnea nor  retractions. Breath sounds are clear  Gastrointestinal: Soft and nontender. No distention. Musculoskeletal: Nontender with normal range of motion in all extremities Neurologic: Patient does have slight slurred speech likely secondary to significant facial droop.  Patient has a significant left facial droop and states decreased sensation upon testing as well.  Equal grip strengths.  5/5 motor in bilateral upper and lower extremities with normal sensation equal bilaterally.  Facial droop does affect the patient's left eyelid and eyebrow.  Unable to fully close the left eye. Skin:  Skin is warm, dry and intact.  Psychiatric: Mood and affect are normal.  ____________________________________________    EKG  EKG viewed and interpreted by myself shows sinus bradycardia 50 bpm with a narrow QRS, normal axis, normal intervals, no concerning ST changes.  ____________________________________________    RADIOLOGY  CT scan head is negative for acute abnormality.  ____________________________________________   INITIAL IMPRESSION / ASSESSMENT AND PLAN / ED COURSE  Pertinent labs & imaging results that were available during my care of the patient were reviewed by me and considered in my medical decision making (see chart for details).   Patient presents to the emergency department for left facial droop and numbness since yesterday afternoon as well as mild headache and slurred speech.  Symptoms do appear suggestive of Bell's palsy however given the numbness I believe MRI imaging is warranted to rule out CVA.  Discussed this plan of care with the patient who is agreeable.  Reassuringly thus far patient's work-up has been normal including CT of the head.  Patient's MRI is negative, making Bell's palsy extremely likely.  I discussed with the patient and [facility], medications and PCP follow-up as well as return precautions.  Patient agreeable to plan of care.  Carlos Contreras was evaluated in Emergency  Department on 06/15/2020 for the symptoms described in the history of present illness. He was evaluated in the context of the global COVID-19 pandemic, which necessitated consideration that the patient might be at risk for infection with the SARS-CoV-2 virus that causes COVID-19. Institutional protocols and algorithms that pertain to the evaluation of patients at risk for COVID-19 are in a state of rapid change based on information released by regulatory bodies including the CDC and federal and state organizations. These policies and algorithms were followed during the patient's care in the ED.  ____________________________________________   FINAL CLINICAL IMPRESSION(S) / ED DIAGNOSES  Left facial droop Bell's palsy   06/17/2020, MD 06/15/20 1826

## 2020-06-15 NOTE — ED Notes (Signed)
Patient reports numbness and paralysis to left side of his face starting around 6pm yesterday, 06/14/2020. Patient reports family hx of Bells Palsy, and is concerned about that. Patient is noted to have paralysis of left side of face, resulting in some dysarthria as well. Patient denies other symptoms.

## 2020-06-15 NOTE — ED Triage Notes (Addendum)
Pt to ED POV for left side facial droop and numbness that started at 1800 last night. Denies weakness to left arm or leg. Slurred speech noted.  VAN neg

## 2020-11-21 ENCOUNTER — Emergency Department
Admission: EM | Admit: 2020-11-21 | Discharge: 2020-11-21 | Disposition: A | Discharge status: Home / Self Care | Payer: Medicare Other | Attending: Emergency Medicine | Admitting: Emergency Medicine

## 2020-11-21 ENCOUNTER — Other Ambulatory Visit: Payer: Self-pay

## 2020-11-21 DIAGNOSIS — M5432 Sciatica, left side: Secondary | ICD-10-CM

## 2020-11-21 DIAGNOSIS — Z79899 Other long term (current) drug therapy: Secondary | ICD-10-CM | POA: Diagnosis not present

## 2020-11-21 DIAGNOSIS — M5442 Lumbago with sciatica, left side: Secondary | ICD-10-CM | POA: Diagnosis not present

## 2020-11-21 DIAGNOSIS — I10 Essential (primary) hypertension: Secondary | ICD-10-CM | POA: Diagnosis not present

## 2020-11-21 DIAGNOSIS — M545 Low back pain, unspecified: Secondary | ICD-10-CM | POA: Diagnosis present

## 2020-11-21 DIAGNOSIS — E119 Type 2 diabetes mellitus without complications: Secondary | ICD-10-CM | POA: Insufficient documentation

## 2020-11-21 DIAGNOSIS — Z87891 Personal history of nicotine dependence: Secondary | ICD-10-CM | POA: Insufficient documentation

## 2020-11-21 MED ORDER — CYCLOBENZAPRINE HCL 5 MG PO TABS: 5.0000 mg | ORAL_TABLET | Freq: Three times a day (TID) | ORAL | 0 refills | Status: DC | PRN

## 2020-11-21 MED ORDER — PREDNISONE 10 MG PO TABS: 10.0000 mg | ORAL_TABLET | Freq: Every day | ORAL | 0 refills | Status: DC

## 2020-11-21 NOTE — Discharge Instructions (Addendum)
Please take medications as prescribed.  He may continue with Tylenol for additional pain relief.  Call primary care provider tomorrow to schedule follow-up appointment for recheck in 1 week if no improvement with prednisone and muscle relaxer.  Return to the ER for any increasing pain, fevers, weakness, worsening symptoms or urgent changes in health.

## 2020-11-21 NOTE — ED Triage Notes (Addendum)
Pt to ER via POV with complaints of left thigh pain x2 weeks, denies injury. Reports difficulty bending extremity and walking. States pain radiates down into foot.   Denies hx of blood clots. Denies SHOB.

## 2020-11-21 NOTE — ED Notes (Signed)
See triage note  presents with left leg pain  states pain starts in lower back and moves into leg  ambulates with slight limp  denies any injury

## 2020-11-21 NOTE — ED Provider Notes (Signed)
[facility] EMERGENCY DEPARTMENT Provider Note   CSN: [id number] Arrival date & time: 11/21/20  1706     History Chief Complaint  Patient presents with   Leg Pain    Carlos Contreras is a 65 y.o. male.  Presents to the emergency department evaluation of left leg pain.  He describes left lower back pain radiating down into the posterior thigh, calf and foot.  He describes a burning numbness and tingling that is worse with weightbearing activity.  He denies any weakness or loss of bowel or bladder symptoms.  He is ambulatory with no assistive device.  He has been taking Tylenol with no relief.  Patient states his symptoms been present for 2 weeks.  No history of sciatica.  He denies any fevers, abdominal pain, chest pain, shortness of breath, leg swelling.  No history of blood clots.  HPI     Past Medical History:  Diagnosis Date   Diabetes mellitus without complication (HCC)    Type II   Hypertension    Iritis    both eyes   Seizures (HCC)     Patient Active Problem List   Diagnosis Date Noted   Alcohol dependence (HCC) 05/12/2013   Cannabis dependence, continuous (HCC) 05/05/2013   Cocaine dependence with or without physiological dependence (HCC) 05/05/2013    History reviewed. No pertinent surgical history.     Family History  Problem Relation Age of Onset   Alcohol abuse Father    Diabetes Father    Heart attack Father    Alcohol abuse Maternal Uncle    Alcohol abuse Paternal Grandfather    Diabetes Mother    Diabetes Sister     Social History   Tobacco Use   Smoking status: Former   Smokeless tobacco: Never  Substance Use Topics   Alcohol use: No   Drug use: Not Currently    Types: "Crack" cocaine, Marijuana, Cocaine    Home Medications Prior to Admission medications   Medication Sig Start Date End Date Taking? Authorizing Provider  cyclobenzaprine (FLEXERIL) 5 MG tablet Take 1-2 tablets (5-10 mg total) by mouth 3 (three) times  daily as needed for muscle spasms. 11/21/20  Yes Evon Slack, PA-C  predniSONE (DELTASONE) 10 MG tablet Take 1 tablet (10 mg total) by mouth daily. 6,5,4,3,2,1 six day taper 11/21/20  Yes Evon Slack, PA-C  amLODipine (NORVASC) 10 MG tablet Take 10 mg by mouth daily.    [provider]  ibuprofen (ADVIL,MOTRIN) 200 MG tablet Take 200 mg by mouth every 6 (six) hours as needed.    [provider]  ketorolac (ACULAR) 0.5 % ophthalmic solution Place 1 drop into the right eye 3 (three) times daily as needed. 10/09/18   Jene Every, MD  lamoTRIgine (LAMICTAL) 100 MG tablet Take 100 mg by mouth daily.    [provider]  lisinopril (PRINIVIL,ZESTRIL) 20 MG tablet Take 20 mg by mouth daily.    [provider]  Polyethyl Glycol-Propyl Glycol (LUBRICATING EYE DROPS) 0.4-0.3 % SOLN Apply lubricating eyedrop to left eye as needed for dryness, and just before going to sleep. 06/15/20   Minna Antis, MD  valACYclovir (VALTREX) 1000 MG tablet Take 1 tablet (1,000 mg total) by mouth 3 (three) times daily. 06/15/20   Minna Antis, MD    Allergies    Patient has no known allergies.  Review of Systems   Review of Systems  Constitutional:  Negative for fever.  Respiratory:  Negative for shortness  of breath.   Cardiovascular:  Negative for chest pain and leg swelling.  Gastrointestinal:  Negative for abdominal pain.  Genitourinary:  Negative for difficulty urinating, dysuria and urgency.  Musculoskeletal:  Positive for back pain. Negative for gait problem, joint swelling, myalgias and neck pain.  Skin:  Negative for rash.  Neurological:  Positive for numbness. Negative for dizziness and headaches.   Physical Exam Updated Vital Signs BP 118/70   Pulse 60   Temp 98.6 F (37 C) (Oral)   Resp 18   Ht 5\' 6"  (1.676 m)   Wt 81.6 kg   SpO2 96%   BMI 29.05 kg/m   Physical Exam Constitutional:      Appearance: He is well-developed.  HENT:     Head:  Normocephalic and atraumatic.  Eyes:     Conjunctiva/sclera: Conjunctivae normal.  Cardiovascular:     Rate and Rhythm: Normal rate.  Pulmonary:     Effort: Pulmonary effort is normal. No respiratory distress.  Abdominal:     General: There is no distension.     Tenderness: There is no abdominal tenderness. There is no guarding.  Musculoskeletal:        General: Normal range of motion.     Cervical back: Normal range of motion.     Comments: Lumbar Spine: Examination of the lumbar spine reveals no bony abnormality, no edema, and no ecchymosis.  There is no step off.  The patient has full range of motion of the lumbar spine with flexion and extension.  The patient has normal lateral bend and rotation.  The patient has no pain with range of motion activities.  The patient has a negative axial load test, and a negative rotational Waddell test.  The patient is non tender along the spinous process.  The patient is non tender along the paravertebral muscles, with no muscle spasms.  The patient is non tender along the iliac crest.  The patient is non tender in the sciatic notch.  The patient is non tender along the Sacroiliac joint.  There is no Coccyx joint tenderness.    Bilateral Lower Extremities: Examination of the lower extremities reveals no bony abnormality, no edema, and no ecchymosis.  The patient has full active and passive range of motion of the hips, knees, and ankles.  There is no discomfort with range of motion exercises.  The patient is non tender along the greater trochanter region.  The patient has a negative ' test bilaterally.  There is normal skin warmth.  There is normal capillary refill bilaterally.    Neurologic: The patient has a negative straight leg raise.  The patient has normal muscle strength testing for the quadriceps, calves, ankle dorsiflexion, ankle plantarflexion, and extensor hallicus longus.  The patient has sensation that is intact to light touch.  Patellar  tendon reflexes are normal.  Skin:    General: Skin is warm.     Findings: No rash.  Neurological:     Mental Status: He is alert and oriented to person, place, and time.  Psychiatric:        Behavior: Behavior normal.        Thought Content: Thought content normal.    ED Results / Procedures / Treatments   Labs (all labs ordered are listed, but only abnormal results are displayed) Labs Reviewed - No data to display  EKG None  Radiology No results found.  Procedures Procedures   Medications Ordered in ED Medications - No data to display  ED  Course  I have reviewed the triage vital signs and the nursing notes.  Pertinent labs & imaging results that were available during my care of the patient were reviewed by me and considered in my medical decision making (see chart for details).    MDM Rules/Calculators/A&P                         65 year old male with 2 weeks of left lumbar radiculopathy.  Vital signs are stable, afebrile.  No signs of DVT in lower extremities.  He has no neurological deficits or weakness.  He ambulates well.  He will start 6-day steroid taper and be given a muscle relaxer.  He will call PCP tomorrow to schedule follow-up appointment for recheck in 1 week.  He understands signs and symptoms to return to the ER for. Final Clinical Impression(s) / ED Diagnoses Final diagnoses:  Sciatica, left side    Rx / DC Orders ED Discharge Orders          Ordered    predniSONE (DELTASONE) 10 MG tablet  Daily        11/21/20 1831    cyclobenzaprine (FLEXERIL) 5 MG tablet  3 times daily PRN        11/21/20 1831             Ronnette Juniper 11/21/20 1837    Chesley Noon, MD 11/21/20 [phone]

## 2021-01-18 ENCOUNTER — Other Ambulatory Visit: Payer: Self-pay

## 2021-01-18 ENCOUNTER — Emergency Department
Admission: EM | Admit: 2021-01-18 | Discharge: 2021-01-18 | Disposition: A | Discharge status: Home / Self Care | Payer: Medicare Other | Attending: Emergency Medicine | Admitting: Emergency Medicine

## 2021-01-18 DIAGNOSIS — M545 Low back pain, unspecified: Secondary | ICD-10-CM | POA: Diagnosis present

## 2021-01-18 DIAGNOSIS — M5416 Radiculopathy, lumbar region: Secondary | ICD-10-CM | POA: Diagnosis not present

## 2021-01-18 DIAGNOSIS — I10 Essential (primary) hypertension: Secondary | ICD-10-CM | POA: Diagnosis not present

## 2021-01-18 DIAGNOSIS — E119 Type 2 diabetes mellitus without complications: Secondary | ICD-10-CM | POA: Insufficient documentation

## 2021-01-18 DIAGNOSIS — Z87891 Personal history of nicotine dependence: Secondary | ICD-10-CM | POA: Insufficient documentation

## 2021-01-18 DIAGNOSIS — Z79899 Other long term (current) drug therapy: Secondary | ICD-10-CM | POA: Insufficient documentation

## 2021-01-18 MED ORDER — OXYCODONE HCL 5 MG PO TABS
5.0000 mg | ORAL_TABLET | Freq: Once | ORAL | Status: AC
  Administered 2021-01-18: 5 mg via ORAL
  Filled 2021-01-18: qty 1

## 2021-01-18 MED ORDER — HYDROCODONE-ACETAMINOPHEN 5-325 MG PO TABS: 1.0000 | ORAL_TABLET | Freq: Four times a day (QID) | ORAL | 0 refills | Status: AC | PRN

## 2021-01-18 MED ORDER — HYDROCODONE-ACETAMINOPHEN 5-325 MG PO TABS: 1.0000 | ORAL_TABLET | Freq: Four times a day (QID) | ORAL | 0 refills | Status: DC | PRN

## 2021-01-18 MED ORDER — ORPHENADRINE CITRATE 30 MG/ML IJ SOLN
60.0000 mg | Freq: Two times a day (BID) | INTRAMUSCULAR | Status: DC
  Administered 2021-01-18: 60 mg via INTRAMUSCULAR
  Filled 2021-01-18: qty 2

## 2021-01-18 MED ORDER — TIZANIDINE HCL 4 MG PO TABS: 4.0000 mg | ORAL_TABLET | Freq: Three times a day (TID) | ORAL | 0 refills | Status: DC

## 2021-01-18 MED ORDER — KETOROLAC TROMETHAMINE 60 MG/2ML IM SOLN
30.0000 mg | Freq: Once | INTRAMUSCULAR | Status: AC
  Administered 2021-01-18: 30 mg via INTRAMUSCULAR
  Filled 2021-01-18: qty 2

## 2021-01-18 NOTE — ED Triage Notes (Signed)
Pt states he has pain in his left leg starting in his hip and going all the way down- pt states he has not injured it and that it started out of nowhere- pt states he was told one time he may have a pinched nerve

## 2021-01-18 NOTE — ED Provider Notes (Signed)
[facility] Emergency Department Provider Note ____________________________________________  Time seen: Approximately 12:15 PM  I have reviewed the triage vital signs and the nursing notes.  HISTORY  Chief Complaint Leg Pain   HPI Carlos Contreras is a 65 y.o. male presents to the emergency department for treatment and evaluation of left-sided back pain that radiates down the left leg.  He is currently on steroid but does not feel it is helping.  He is also taking Robaxin without relief.  Symptoms started about a month ago but have progressively worsened.  No injury.  Past Medical History:  Diagnosis Date   Diabetes mellitus without complication (HCC)    Type II   Hypertension    Iritis    both eyes   Seizures (HCC)     Patient Active Problem List   Diagnosis Date Noted   Alcohol dependence (HCC) 05/12/2013   Cannabis dependence, continuous (HCC) 05/05/2013   Cocaine dependence with or without physiological dependence (HCC) 05/05/2013    History reviewed. No pertinent surgical history.  Prior to Admission medications   Medication Sig Start Date End Date Taking? Authorizing Provider  tiZANidine (ZANAFLEX) 4 MG tablet Take 1 tablet (4 mg total) by mouth 3 (three) times daily. 01/18/21  Yes Rosaly Labarbera B, FNP  amLODipine (NORVASC) 10 MG tablet Take 10 mg by mouth daily.    [provider]  cyclobenzaprine (FLEXERIL) 5 MG tablet Take 1-2 tablets (5-10 mg total) by mouth 3 (three) times daily as needed for muscle spasms. 11/21/20   Evon Slack, PA-C  HYDROcodone-acetaminophen (NORCO/VICODIN) 5-325 MG tablet Take 1 tablet by mouth every 6 (six) hours as needed for up to 3 days for severe pain. 01/18/21 01/21/21  Fahmida Jurich, Rulon Eisenmenger B, FNP  ibuprofen (ADVIL,MOTRIN) 200 MG tablet Take 200 mg by mouth every 6 (six) hours as needed.    [provider]  ketorolac (ACULAR) 0.5 % ophthalmic solution Place 1 drop into the right eye 3 (three) times  daily as needed. 10/09/18   Jene Every, MD  lamoTRIgine (LAMICTAL) 100 MG tablet Take 100 mg by mouth daily.    [provider]  lisinopril (PRINIVIL,ZESTRIL) 20 MG tablet Take 20 mg by mouth daily.    [provider]  Polyethyl Glycol-Propyl Glycol (LUBRICATING EYE DROPS) 0.4-0.3 % SOLN Apply lubricating eyedrop to left eye as needed for dryness, and just before going to sleep. 06/15/20   Minna Antis, MD  predniSONE (DELTASONE) 10 MG tablet Take 1 tablet (10 mg total) by mouth daily. 6,5,4,3,2,1 six day taper 11/21/20   Evon Slack, PA-C  valACYclovir (VALTREX) 1000 MG tablet Take 1 tablet (1,000 mg total) by mouth 3 (three) times daily. 06/15/20   Minna Antis, MD    Allergies Patient has no known allergies.  Family History  Problem Relation Age of Onset   Alcohol abuse Father    Diabetes Father    Heart attack Father    Alcohol abuse Maternal Uncle    Alcohol abuse Paternal Grandfather    Diabetes Mother    Diabetes Sister     Social History Social History   Tobacco Use   Smoking status: Former   Smokeless tobacco: Never  Substance Use Topics   Alcohol use: No   Drug use: Not Currently    Types: "Crack" cocaine, Marijuana, Cocaine    Review of Systems Constitutional: Well appearing. Respiratory: Negative for dyspnea. Cardiovascular: Negative for change in skin temperature or color. Musculoskeletal:   Negative for chronic  steroid use   Negative for trauma in the presence of osteoporosis  Negative for age over 39 and trauma.  Negative for constitutional symptoms, or history of cancer  Negative for pain worse at night. Skin: Negative for rash, lesion, or wound.  Genitourinary: Negative for urinary retention. Rectal: Negative for fecal incontinence or new onset constipation/bowel habit changes. Hematological/Immunilogical: Negative for immunosuppression, IV drug use, or fever Neurological: Positive for burning, tingling, numb,  electric, radiating pain in the left lower extremity.                        Negative for saddle anesthesia.                        Negative for focal neurologic deficit, progressive or disabling symptoms             Negative for saddle anesthesia. ____________________________________________   PHYSICAL EXAM:  VITAL SIGNS: ED Triage Vitals  Enc Vitals Group     BP 01/18/21 1138 (!) 188/76     Pulse Rate 01/18/21 1138 65     Resp 01/18/21 1138 18     Temp 01/18/21 1138 97.7 F (36.5 C)     Temp Source 01/18/21 1138 Oral     SpO2 01/18/21 1138 98 %     Weight 01/18/21 1137 177 lb (80.3 kg)     Height 01/18/21 1137 5\' 6"  (1.676 m)     Head Circumference --      Peak Flow --      Pain Score 01/18/21 1137 10     Pain Loc --      Pain Edu? --      Excl. in GC? --     Constitutional: Alert and oriented. Well appearing and in no acute distress. Eyes: Conjunctivae are clear without discharge or drainage.  Head: Atraumatic. Neck: Full, active range of motion. Respiratory: Respirations even and unlabored. Musculoskeletal: Apprehensive, limited ROM of the back and extremities, Strength 5/5 of the lower extremities as tested. Neurologic: Reflexes of the lower extremities are 2+.  Positive straight leg raise on the left side. Skin: Atraumatic.  Psychiatric: Behavior and affect are normal.  ____________________________________________   LABS (all labs ordered are listed, but only abnormal results are displayed)  Labs Reviewed - No data to display ____________________________________________  RADIOLOGY  Not indicated ____________________________________________   PROCEDURES  Procedure(s) performed:  Procedures ____________________________________________   INITIAL IMPRESSION / ASSESSMENT AND PLAN / ED COURSE  Carlos Contreras is a 66 y.o. male presenting to the emergency department for evaluation of back pain.  He has no fever or red flags of back pain. No dysuria.    While here, he was treated with norflex, toradol, and roxicet with significant relief. He will be discharged home with zanaflex and a short course of Norco. He was advised to finish the prednisone and stop the robaxin. Verbalized understanding. He is to follow up with primary care or orthopedics if not improving over the next few days.  Medications  orphenadrine (NORFLEX) injection 60 mg (60 mg Intramuscular Given 01/18/21 1228)  ketorolac (TORADOL) injection 30 mg (30 mg Intramuscular Given 01/18/21 1228)  oxyCODONE (Oxy IR/ROXICODONE) immediate release tablet 5 mg (5 mg Oral Given 01/18/21 1228)    ED Discharge Orders          Ordered    tiZANidine (ZANAFLEX) 4 MG tablet  3 times daily        01/18/21  1420    HYDROcodone-acetaminophen (NORCO/VICODIN) 5-325 MG tablet  Every 6 hours PRN,   Status:  Discontinued        01/18/21 1420    HYDROcodone-acetaminophen (NORCO/VICODIN) 5-325 MG tablet  Every 6 hours PRN        01/18/21 1421               Pertinent labs & imaging results that were available during my care of the patient were reviewed by me and considered in my medical decision making (see chart for details).   _________________________________________   FINAL CLINICAL IMPRESSION(S) / ED DIAGNOSES   Final diagnoses:  Acute left lumbar radiculopathy     If controlled substance prescribed during this visit, 12 month history viewed on the [facility] prior to issuing an initial prescription for Schedule II or III opiod.    Chinita Pester, FNP 01/18/21 1501    Minna Antis, MD 01/18/21 1506

## 2021-01-27 ENCOUNTER — Other Ambulatory Visit: Payer: Self-pay

## 2021-01-27 ENCOUNTER — Encounter: Payer: Self-pay | Admitting: Emergency Medicine

## 2021-01-27 ENCOUNTER — Emergency Department
Admission: EM | Admit: 2021-01-27 | Discharge: 2021-01-27 | Disposition: A | Discharge status: Home / Self Care | Payer: Medicare Other | Attending: Emergency Medicine | Admitting: Emergency Medicine

## 2021-01-27 DIAGNOSIS — I10 Essential (primary) hypertension: Secondary | ICD-10-CM | POA: Insufficient documentation

## 2021-01-27 DIAGNOSIS — M25572 Pain in left ankle and joints of left foot: Secondary | ICD-10-CM | POA: Diagnosis not present

## 2021-01-27 DIAGNOSIS — M549 Dorsalgia, unspecified: Secondary | ICD-10-CM | POA: Insufficient documentation

## 2021-01-27 DIAGNOSIS — E119 Type 2 diabetes mellitus without complications: Secondary | ICD-10-CM | POA: Insufficient documentation

## 2021-01-27 DIAGNOSIS — Z87891 Personal history of nicotine dependence: Secondary | ICD-10-CM | POA: Diagnosis not present

## 2021-01-27 DIAGNOSIS — M79675 Pain in left toe(s): Secondary | ICD-10-CM | POA: Diagnosis present

## 2021-01-27 DIAGNOSIS — Z79899 Other long term (current) drug therapy: Secondary | ICD-10-CM | POA: Insufficient documentation

## 2021-01-27 LAB — CBC WITH DIFFERENTIAL/PLATELET
Abs Immature Granulocytes: 0.01 10*3/uL (ref 0.00–0.07)
Basophils Absolute: 0 10*3/uL (ref 0.0–0.1)
Basophils Relative: 1 %
Eosinophils Absolute: 0.1 10*3/uL (ref 0.0–0.5)
Eosinophils Relative: 1 %
HCT: 45.1 % (ref 39.0–52.0)
Hemoglobin: 14.8 g/dL (ref 13.0–17.0)
Immature Granulocytes: 0 %
Lymphocytes Relative: 39 %
Lymphs Abs: 1.7 10*3/uL (ref 0.7–4.0)
MCH: 27 pg (ref 26.0–34.0)
MCHC: 32.8 g/dL (ref 30.0–36.0)
MCV: 82.1 fL (ref 80.0–100.0)
Monocytes Absolute: 0.4 10*3/uL (ref 0.1–1.0)
Monocytes Relative: 8 %
Neutro Abs: 2.2 10*3/uL (ref 1.7–7.7)
Neutrophils Relative %: 51 %
Platelets: 260 10*3/uL (ref 150–400)
RBC: 5.49 MIL/uL (ref 4.22–5.81)
RDW: 14.1 % (ref 11.5–15.5)
WBC: 4.4 10*3/uL (ref 4.0–10.5)
nRBC: 0 % (ref 0.0–0.2)

## 2021-01-27 LAB — BASIC METABOLIC PANEL
Anion gap: 6 (ref 5–15)
BUN: 17 mg/dL (ref 8–23)
CO2: 28 mmol/L (ref 22–32)
Calcium: 9.3 mg/dL (ref 8.9–10.3)
Chloride: 103 mmol/L (ref 98–111)
Creatinine, Ser: 1.07 mg/dL (ref 0.61–1.24)
GFR, Estimated: >60 mL/min (ref >60)
Glucose, Bld: 132 mg/dL — ABNORMAL HIGH (ref 70–99)
Potassium: 3.7 mmol/L (ref 3.5–5.1)
Sodium: 137 mmol/L (ref 135–145)

## 2021-01-27 LAB — URIC ACID: Uric Acid, Serum: 5.2 mg/dL (ref 3.7–8.6)

## 2021-01-27 MED ORDER — TIZANIDINE HCL 4 MG PO TABS: 4.0000 mg | ORAL_TABLET | Freq: Three times a day (TID) | ORAL | 0 refills | Status: AC

## 2021-01-27 MED ORDER — TIZANIDINE HCL 4 MG PO TABS: 4.0000 mg | ORAL_TABLET | Freq: Three times a day (TID) | ORAL | 0 refills | Status: DC

## 2021-01-27 MED ORDER — NABUMETONE 750 MG PO TABS: 750.0000 mg | ORAL_TABLET | Freq: Two times a day (BID) | ORAL | 0 refills | Status: DC

## 2021-01-27 MED ORDER — NABUMETONE 750 MG PO TABS: 750.0000 mg | ORAL_TABLET | Freq: Two times a day (BID) | ORAL | 0 refills | Status: AC

## 2021-01-27 NOTE — Discharge Instructions (Signed)
Your exam and labs are normal at this time. There is no evidence of gout based on your labs and symptoms. Follow-up with your provider for ongoing pain management.

## 2021-01-27 NOTE — ED Provider Notes (Signed)
[facility] Emergency Department Provider Note ____________________________________________  Time seen: 1136  I have reviewed the triage vital signs and the nursing notes.  HISTORY  Chief Complaint  Back Pain   HPI Carlos Contreras is a 65 y.o. male presents to the ED with resolved LBP, now only reporting pain to the left great toe and ankle. He voices concern for gout, after an Child psychotherapist. He denies any history of gout, recent trauma or injury. He has been taking OTC ibuprofen with limited relief.   Past Medical History:  Diagnosis Date   Diabetes mellitus without complication (HCC)    Type II   Hypertension    Iritis    both eyes   Seizures (HCC)     Patient Active Problem List   Diagnosis Date Noted   Alcohol dependence (HCC) 05/12/2013   Cannabis dependence, continuous (HCC) 05/05/2013   Cocaine dependence with or without physiological dependence (HCC) 05/05/2013    History reviewed. No pertinent surgical history.  Prior to Admission medications   Medication Sig Start Date End Date Taking? Authorizing Provider  amLODipine (NORVASC) 10 MG tablet Take 10 mg by mouth daily.    [provider]  ibuprofen (ADVIL,MOTRIN) 200 MG tablet Take 200 mg by mouth every 6 (six) hours as needed.    [provider]  lamoTRIgine (LAMICTAL) 100 MG tablet Take 100 mg by mouth daily.    [provider]  lisinopril (PRINIVIL,ZESTRIL) 20 MG tablet Take 20 mg by mouth daily.    [provider]  nabumetone (RELAFEN) 750 MG tablet Take 1 tablet (750 mg total) by mouth 2 (two) times daily for 15 days. 01/27/21 02/11/21  Keyatta Tolles, Charlesetta Ivory, PA-C  Polyethyl Glycol-Propyl Glycol (LUBRICATING EYE DROPS) 0.4-0.3 % SOLN Apply lubricating eyedrop to left eye as needed for dryness, and just before going to sleep. 06/15/20   Minna Antis, MD  tiZANidine (ZANAFLEX) 4 MG tablet Take 1 tablet (4 mg total) by mouth 3 (three) times daily for  7 days. 01/27/21 02/03/21  Shantina Chronister, Charlesetta Ivory, PA-C  valACYclovir (VALTREX) 1000 MG tablet Take 1 tablet (1,000 mg total) by mouth 3 (three) times daily. 06/15/20   Minna Antis, MD    Allergies Patient has no known allergies.  Family History  Problem Relation Age of Onset   Alcohol abuse Father    Diabetes Father    Heart attack Father    Alcohol abuse Maternal Uncle    Alcohol abuse Paternal Grandfather    Diabetes Mother    Diabetes Sister     Social History Social History   Tobacco Use   Smoking status: Former   Smokeless tobacco: Never  Substance Use Topics   Alcohol use: No   Drug use: Not Currently    Types: "Crack" cocaine, Marijuana, Cocaine    Review of Systems  Constitutional: Negative for fever. Cardiovascular: Negative for chest pain. Respiratory: Negative for shortness of breath. Gastrointestinal: Negative for abdominal pain, vomiting and diarrhea. Genitourinary: Negative for dysuria. Musculoskeletal: Negative for back pain. Left great toe and ankle pain.  Skin: Negative for rash. Neurological: Negative for headaches, focal weakness or numbness. ____________________________________________  PHYSICAL EXAM:  VITAL SIGNS: ED Triage Vitals  Enc Vitals Group     BP 01/27/21 1018 139/87     Pulse Rate 01/27/21 1018 69     Resp 01/27/21 1018 20     Temp 01/27/21 1018 98.7 F (37.1 C)     Temp Source 01/27/21 1018 Oral  SpO2 01/27/21 1018 95 %     Weight 01/27/21 0949 176 lb 12.9 oz (80.2 kg)     Height 01/27/21 0949 5\' 6"  (1.676 m)     Head Circumference --      Peak Flow --      Pain Score 01/27/21 0949 7     Pain Loc --      Pain Edu? --      Excl. in GC? --     Constitutional: Alert and oriented. Well appearing and in no distress. Head: Normocephalic and atraumatic. Eyes: Conjunctivae are normal. Normal extraocular movements Cardiovascular: Normal rate, regular rhythm. Normal distal pulses. Respiratory: Normal respiratory  effort. No wheezes/rales/rhonchi. Gastrointestinal: Soft and nontender. No distention. Musculoskeletal: Nontender with normal range of motion in all extremities. Left foot without obvious deformity, dislocation, erythema, or ecchymosis.  Neurologic:  Normal gross sensation. Normal speech and language. No gross focal neurologic deficits are appreciated. Skin:  Skin is warm, dry and intact. No rash noted. Psychiatric: Mood and affect are normal. Patient exhibits appropriate insight and judgment. ____________________________________________    {LABS (pertinent positives/negatives)  Labs Reviewed  BASIC METABOLIC PANEL - Abnormal; Notable for the following components:      Result Value   Glucose, Bld 132 (*)    All other components within normal limits  URIC ACID  CBC WITH DIFFERENTIAL/PLATELET  ____________________________________________  {EKG  ____________________________________________   RADIOLOGY Official radiology report(s): No results found. ____________________________________________  PROCEDURES   Procedures ____________________________________________   INITIAL IMPRESSION / ASSESSMENT AND PLAN / ED COURSE  As part of my medical decision making, I reviewed the following data within the electronic MEDICAL RECORD NUMBER Labs reviewed WNL and Notes from prior ED visits   DDX: radicular LBP, gout, joint pain  Patient with a ED evaluation of ongoing pain in the left lower extremity primarily at the ankle and great toe.  Patient was concerned for gout because of high reflux complaints in ED, found to have a normal lab panel without signs of elevated uric acid.  Patient has encouraged by the exam at this time he will follow with primary provider for ongoing symptom management.  Prescription for tizanidine as well as well and is provided with benefit.  He will follow-up as directed and return if needed.  Carlos Contreras was evaluated in Emergency Department on 01/27/2021 for the  symptoms described in the history of present illness. He was evaluated in the context of the global COVID-19 pandemic, which necessitated consideration that the patient might be at risk for infection with the SARS-CoV-2 virus that causes COVID-19. Institutional protocols and algorithms that pertain to the evaluation of patients at risk for COVID-19 are in a state of rapid change based on information released by regulatory bodies including the CDC and federal and state organizations. These policies and algorithms were followed during the patient's care in the ED. ____________________________________________  FINAL CLINICAL IMPRESSION(S) / ED DIAGNOSES  Final diagnoses:  Great toe pain, left  Acute left ankle pain      Anisia Leija, 14/01/2021, PA-C 01/27/21 1328    14/12/22, MD 01/27/21 1926

## 2021-01-27 NOTE — ED Notes (Signed)
Pt to ED, states he believes he has gout. States L great toe is extremely painful. Pt also has sharp pain all down lateral L leg. Pt states he took 16 ibuprophen at home since last night. Advised not to take this amount of ibuprophen because it is caustic to GI lining.

## 2021-01-27 NOTE — ED Notes (Signed)
Pt ambulatory to the restroom with cane, no assistance needed.

## 2021-01-27 NOTE — ED Triage Notes (Signed)
C/O left lower back pain radiating down left leg to left great toe and ankle.  AAOx3.  Skin warm and dry. NAD

## 2021-03-14 ENCOUNTER — Encounter: Payer: Self-pay | Admitting: Emergency Medicine

## 2021-03-14 ENCOUNTER — Other Ambulatory Visit: Payer: Self-pay

## 2021-03-14 ENCOUNTER — Emergency Department
Admission: EM | Admit: 2021-03-14 | Discharge: 2021-03-14 | Disposition: A | Discharge status: Home / Self Care | Payer: Medicare Other | Attending: Emergency Medicine | Admitting: Emergency Medicine

## 2021-03-14 DIAGNOSIS — Z79899 Other long term (current) drug therapy: Secondary | ICD-10-CM | POA: Diagnosis not present

## 2021-03-14 DIAGNOSIS — I10 Essential (primary) hypertension: Secondary | ICD-10-CM | POA: Insufficient documentation

## 2021-03-14 DIAGNOSIS — E119 Type 2 diabetes mellitus without complications: Secondary | ICD-10-CM | POA: Insufficient documentation

## 2021-03-14 DIAGNOSIS — R200 Anesthesia of skin: Secondary | ICD-10-CM | POA: Diagnosis present

## 2021-03-14 DIAGNOSIS — M5416 Radiculopathy, lumbar region: Secondary | ICD-10-CM | POA: Insufficient documentation

## 2021-03-14 MED ORDER — PREDNISONE 10 MG PO TABS: 10.0000 mg | ORAL_TABLET | Freq: Every day | ORAL | 0 refills | Status: DC

## 2021-03-14 MED ORDER — GABAPENTIN 300 MG PO CAPS: 300.0000 mg | ORAL_CAPSULE | Freq: Every day | ORAL | 0 refills | Status: DC

## 2021-03-14 MED ORDER — HYDROCODONE-ACETAMINOPHEN 5-325 MG PO TABS: 1.0000 | ORAL_TABLET | ORAL | 0 refills | Status: DC | PRN

## 2021-03-14 NOTE — ED Provider Notes (Signed)
[facility] EMERGENCY DEPARTMENT Provider Note   CSN: [id number] Arrival date & time: 03/14/21  1506     History  Chief Complaint  Patient presents with   Leg Pain    Carlos Contreras is a 66 y.o. male.  Presents to the emergency department evaluation of left lumbar radiculopathy.  He has a history of diabetes, hypertension, seizures.  He has had 4 months of intermittent pain burning numbness and tingling radiating down the left lateral hip, anterior knee lower leg and into the left great toe.  Has responded well to prednisone, muscle relaxer/Norco in the past.  Denies any recent fall trauma injury, fevers abdominal pain, chest pain shortness of breath.  No loss of bowel or bladder symptoms.  HPI     Home Medications Prior to Admission medications   Medication Sig Start Date End Date Taking? Authorizing Provider  gabapentin (NEURONTIN) 300 MG capsule Take 1 capsule (300 mg total) by mouth at bedtime. 03/14/21 03/14/22 Yes Evon Slack, PA-C  HYDROcodone-acetaminophen (NORCO) 5-325 MG tablet Take 1 tablet by mouth every 4 (four) hours as needed for moderate pain. 03/14/21  Yes Evon Slack, PA-C  predniSONE (DELTASONE) 10 MG tablet Take 1 tablet (10 mg total) by mouth daily. 6,5,4,3,2,1 six day taper 03/14/21  Yes Evon Slack, PA-C  amLODipine (NORVASC) 10 MG tablet Take 10 mg by mouth daily.    [provider]  ibuprofen (ADVIL,MOTRIN) 200 MG tablet Take 200 mg by mouth every 6 (six) hours as needed.    [provider]  lamoTRIgine (LAMICTAL) 100 MG tablet Take 100 mg by mouth daily.    [provider]  lisinopril (PRINIVIL,ZESTRIL) 20 MG tablet Take 20 mg by mouth daily.    [provider]  Polyethyl Glycol-Propyl Glycol (LUBRICATING EYE DROPS) 0.4-0.3 % SOLN Apply lubricating eyedrop to left eye as needed for dryness, and just before going to sleep. 06/15/20   Minna Antis, MD  valACYclovir (VALTREX) 1000 MG  tablet Take 1 tablet (1,000 mg total) by mouth 3 (three) times daily. 06/15/20   Minna Antis, MD      Allergies    Patient has no known allergies.    Review of Systems   Review of Systems  Physical Exam Updated Vital Signs BP 121/81 (BP Location: Left Arm)    Pulse 75    Temp 98.5 F (36.9 C) (Oral)    Resp 18    Ht 5\' 6"  (1.676 m)    Wt 80.2 kg    SpO2 95%    BMI 28.54 kg/m  Physical Exam Constitutional:      Appearance: Normal appearance. He is well-developed.  HENT:     Head: Normocephalic and atraumatic.     Nose: Nose normal. No congestion or rhinorrhea.  Eyes:     Conjunctiva/sclera: Conjunctivae normal.  Cardiovascular:     Rate and Rhythm: Normal rate.  Pulmonary:     Effort: Pulmonary effort is normal. No respiratory distress.  Musculoskeletal:     Cervical back: Normal range of motion.     Comments: Lumbar Spine: Examination of the lumbar spine reveals no bony abnormality, no edema, and no ecchymosis.  There is no step off.  The patient has full range of motion of the lumbar spine with flexion and extension.  The patient has normal lateral bend and rotation.  The patient has no pain with range of motion activities.  The patient has a negative axial load test, and a negative  rotational Waddell test.  The patient is non tender along the spinous process.  The patient is non tender along the paravertebral muscles, with no muscle spasms.  The patient is non tender along the iliac crest.  The patient is non tender in the sciatic notch.  The patient is non tender along the Sacroiliac joint.  There is no Coccyx joint tenderness.    Bilateral Lower Extremities: Examination of the lower extremities reveals no bony abnormality, no edema, and no ecchymosis.  The patient has full active and passive range of motion of the hips, knees, and ankles.  There is no discomfort with range of motion exercises.  The patient is non tender along the greater trochanter region.  The patient has a  negative Denna Haggard' test bilaterally.  There is normal skin warmth.  There is normal capillary refill bilaterally.    Neurologic: The patient has a positive left straight leg raise.  The patient has normal muscle strength testing for the quadriceps, calves, ankle dorsiflexion, ankle plantarflexion, and extensor hallicus longus.  The patient has sensation that is intact to light touch.  The deep tendon reflexes are normal bilaterally   Skin:    General: Skin is warm.     Findings: No rash.  Neurological:     Mental Status: He is alert and oriented to person, place, and time.  Psychiatric:        Behavior: Behavior normal.        Thought Content: Thought content normal.    ED Results / Procedures / Treatments   Labs (all labs ordered are listed, but only abnormal results are displayed) Labs Reviewed - No data to display  EKG None  Radiology No results found.  Procedures Procedures   Medications Ordered in ED Medications - No data to display  ED Course/ Medical Decision Making/ A&P                           Medical Decision Making Risk Prescription drug management.    Final Clinical Impression(s) / ED Diagnoses Final diagnoses:  Subacute left lumbar radiculopathy  66 year old male with acute on chronic left lumbar radiculopathy.  No weakness or neurological deficits.  Vital signs stable, afebrile.  Pain moderate.  No weakness on exam.  We will start mild 6-day steroid taper, gabapentin at bedtime and Norco.  He will call orthopedist on Monday to schedule follow-up appointment.  He understands signs symptoms return to the ER for such as increased pain, weakness, fevers or any urgent changes noted.  Rx / DC Orders ED Discharge Orders          Ordered    predniSONE (DELTASONE) 10 MG tablet  Daily        03/14/21 1542    HYDROcodone-acetaminophen (NORCO) 5-325 MG tablet  Every 4 hours PRN        03/14/21 1542    gabapentin (NEURONTIN) 300 MG capsule  Daily at bedtime         03/14/21 1542              Ronnette Juniper 03/14/21 1547    Chesley Noon, MD 03/14/21 1644

## 2021-03-14 NOTE — Discharge Instructions (Signed)
Please take medication as prescribed.  Call orthopedic office Monday morning to schedule follow-up appointment.  Return to the ER for any increased pain, weakness, fevers, worsening symptoms or urgent changes in health.

## 2021-03-14 NOTE — ED Triage Notes (Signed)
Pt comes into the ED via POV c/o left leg pain from the hip down to the toes.  Pt states the pain has been ongoing x 4 months and he hasnt had insurance to get it checked.  Pt able to ambulate into triage at this time with NAD.  Pt denies any injury to the leg.  Pt does admit to back problems in the past.

## 2021-03-14 NOTE — ED Notes (Signed)
Pt has been seen by pcp and specialty care for same. Pt complains of left leg pain for the last 4 months.

## 2021-11-04 ENCOUNTER — Emergency Department: Payer: 59

## 2021-11-04 ENCOUNTER — Inpatient Hospital Stay
Admission: EM | Admit: 2021-11-04 | Discharge: 2021-11-06 | DRG: 392 | Disposition: A | Discharge status: Home / Self Care | Payer: 59 | Attending: Internal Medicine | Admitting: Internal Medicine

## 2021-11-04 DIAGNOSIS — M5432 Sciatica, left side: Secondary | ICD-10-CM | POA: Diagnosis present

## 2021-11-04 DIAGNOSIS — G8929 Other chronic pain: Secondary | ICD-10-CM | POA: Diagnosis present

## 2021-11-04 DIAGNOSIS — K5732 Diverticulitis of large intestine without perforation or abscess without bleeding: Principal | ICD-10-CM | POA: Diagnosis present

## 2021-11-04 DIAGNOSIS — R109 Unspecified abdominal pain: Secondary | ICD-10-CM | POA: Diagnosis present

## 2021-11-04 DIAGNOSIS — Z87891 Personal history of nicotine dependence: Secondary | ICD-10-CM | POA: Diagnosis not present

## 2021-11-04 DIAGNOSIS — F1020 Alcohol dependence, uncomplicated: Secondary | ICD-10-CM | POA: Diagnosis present

## 2021-11-04 DIAGNOSIS — R071 Chest pain on breathing: Secondary | ICD-10-CM

## 2021-11-04 DIAGNOSIS — N179 Acute kidney failure, unspecified: Secondary | ICD-10-CM | POA: Diagnosis present

## 2021-11-04 DIAGNOSIS — E876 Hypokalemia: Secondary | ICD-10-CM | POA: Diagnosis present

## 2021-11-04 DIAGNOSIS — Z87898 Personal history of other specified conditions: Secondary | ICD-10-CM

## 2021-11-04 DIAGNOSIS — Z79899 Other long term (current) drug therapy: Secondary | ICD-10-CM

## 2021-11-04 DIAGNOSIS — K219 Gastro-esophageal reflux disease without esophagitis: Secondary | ICD-10-CM | POA: Diagnosis present

## 2021-11-04 DIAGNOSIS — F1420 Cocaine dependence, uncomplicated: Secondary | ICD-10-CM | POA: Diagnosis present

## 2021-11-04 DIAGNOSIS — R569 Unspecified convulsions: Secondary | ICD-10-CM

## 2021-11-04 DIAGNOSIS — I10 Essential (primary) hypertension: Secondary | ICD-10-CM | POA: Diagnosis present

## 2021-11-04 DIAGNOSIS — R1013 Epigastric pain: Secondary | ICD-10-CM | POA: Diagnosis present

## 2021-11-04 DIAGNOSIS — M5136 Other intervertebral disc degeneration, lumbar region: Secondary | ICD-10-CM | POA: Diagnosis present

## 2021-11-04 DIAGNOSIS — E119 Type 2 diabetes mellitus without complications: Secondary | ICD-10-CM | POA: Diagnosis present

## 2021-11-04 DIAGNOSIS — K5900 Constipation, unspecified: Secondary | ICD-10-CM | POA: Diagnosis present

## 2021-11-04 DIAGNOSIS — R079 Chest pain, unspecified: Secondary | ICD-10-CM | POA: Diagnosis present

## 2021-11-04 DIAGNOSIS — K5792 Diverticulitis of intestine, part unspecified, without perforation or abscess without bleeding: Secondary | ICD-10-CM

## 2021-11-04 DIAGNOSIS — M549 Dorsalgia, unspecified: Secondary | ICD-10-CM | POA: Diagnosis present

## 2021-11-04 LAB — BASIC METABOLIC PANEL
Anion gap: 11 (ref 5–15)
BUN: 53 mg/dL — ABNORMAL HIGH (ref 8–23)
CO2: 25 mmol/L (ref 22–32)
Calcium: 9.8 mg/dL (ref 8.9–10.3)
Chloride: 101 mmol/L (ref 98–111)
Creatinine, Ser: 2.72 mg/dL — ABNORMAL HIGH (ref 0.61–1.24)
GFR, Estimated: 25 mL/min — ABNORMAL LOW (ref >60)
Glucose, Bld: 135 mg/dL — ABNORMAL HIGH (ref 70–99)
Potassium: 3 mmol/L — ABNORMAL LOW (ref 3.5–5.1)
Sodium: 137 mmol/L (ref 135–145)

## 2021-11-04 LAB — URINALYSIS, ROUTINE W REFLEX MICROSCOPIC
Bilirubin Urine: NEGATIVE
Glucose, UA: 50 mg/dL — AB
Hgb urine dipstick: NEGATIVE
Ketones, ur: NEGATIVE mg/dL
Leukocytes,Ua: NEGATIVE
Nitrite: NEGATIVE
Protein, ur: NEGATIVE mg/dL
Specific Gravity, Urine: 1.009 (ref 1.005–1.030)
pH: 5 (ref 5.0–8.0)

## 2021-11-04 LAB — CBC
HCT: 46.3 % (ref 39.0–52.0)
Hemoglobin: 15 g/dL (ref 13.0–17.0)
MCH: 26.4 pg (ref 26.0–34.0)
MCHC: 32.4 g/dL (ref 30.0–36.0)
MCV: 81.4 fL (ref 80.0–100.0)
Platelets: 347 10*3/uL (ref 150–400)
RBC: 5.69 MIL/uL (ref 4.22–5.81)
RDW: 14.5 % (ref 11.5–15.5)
WBC: 7.3 10*3/uL (ref 4.0–10.5)
nRBC: 0 % (ref 0.0–0.2)

## 2021-11-04 LAB — LIPASE, BLOOD: Lipase: 34 U/L (ref 11–51)

## 2021-11-04 LAB — HEMOGLOBIN A1C
Hgb A1c MFr Bld: 6.4 % — ABNORMAL HIGH (ref 4.8–5.6)
Mean Plasma Glucose: 136.98 mg/dL

## 2021-11-04 LAB — HEPATIC FUNCTION PANEL
ALT: 12 U/L (ref 0–44)
AST: 21 U/L (ref 15–41)
Albumin: 4 g/dL (ref 3.5–5.0)
Alkaline Phosphatase: 61 U/L (ref 38–126)
Bilirubin, Direct: <0.1 mg/dL (ref 0.0–0.2)
Total Bilirubin: 0.5 mg/dL (ref 0.3–1.2)
Total Protein: 7.6 g/dL (ref 6.5–8.1)

## 2021-11-04 LAB — BRAIN NATRIURETIC PEPTIDE: B Natriuretic Peptide: 5.4 pg/mL (ref 0.0–100.0)

## 2021-11-04 LAB — PROTIME-INR
INR: 1.1 (ref 0.8–1.2)
Prothrombin Time: 13.9 seconds (ref 11.4–15.2)

## 2021-11-04 LAB — PHOSPHORUS: Phosphorus: 3.4 mg/dL (ref 2.5–4.6)

## 2021-11-04 LAB — T4, FREE: Free T4: 0.64 ng/dL (ref 0.61–1.12)

## 2021-11-04 LAB — MAGNESIUM: Magnesium: 2.4 mg/dL (ref 1.7–2.4)

## 2021-11-04 LAB — ETHANOL: Alcohol, Ethyl (B): <10 mg/dL (ref <10)

## 2021-11-04 LAB — TROPONIN I (HIGH SENSITIVITY)
Troponin I (High Sensitivity): 9 ng/L (ref <18)
Troponin I (High Sensitivity): 7 ng/L (ref <18)

## 2021-11-04 LAB — SALICYLATE LEVEL: Salicylate Lvl: <7 mg/dL — ABNORMAL LOW (ref 7.0–30.0)

## 2021-11-04 LAB — TSH: TSH: 0.348 u[IU]/mL — ABNORMAL LOW (ref 0.350–4.500)

## 2021-11-04 MED ORDER — SODIUM CHLORIDE 0.9 % IV SOLN: INTRAVENOUS | Status: DC

## 2021-11-04 MED ORDER — GABAPENTIN 300 MG PO CAPS: 300.0000 mg | ORAL_CAPSULE | Freq: Every day | ORAL | Status: DC

## 2021-11-04 MED ORDER — SODIUM CHLORIDE 0.9% FLUSH
3.0000 mL | Freq: Two times a day (BID) | INTRAVENOUS | Status: DC
  Administered 2021-11-05 – 2021-11-06 (×2): 3 mL via INTRAVENOUS

## 2021-11-04 MED ORDER — FAMOTIDINE IN NACL 20-0.9 MG/50ML-% IV SOLN
20.0000 mg | Freq: Once | INTRAVENOUS | Status: AC
Start: 2021-11-04 — End: 2021-11-04
  Administered 2021-11-04: 20 mg via INTRAVENOUS
  Filled 2021-11-04: qty 50

## 2021-11-04 MED ORDER — HYDROCODONE-ACETAMINOPHEN 5-325 MG PO TABS: 1.0000 | ORAL_TABLET | ORAL | Status: DC | PRN

## 2021-11-04 MED ORDER — HEPARIN SODIUM (PORCINE) 5000 UNIT/ML IJ SOLN
5000.0000 [IU] | Freq: Three times a day (TID) | INTRAMUSCULAR | Status: DC
  Administered 2021-11-05 – 2021-11-06 (×5): 5000 [IU] via SUBCUTANEOUS
  Filled 2021-11-04 (×6): qty 1

## 2021-11-04 MED ORDER — LAMOTRIGINE 100 MG PO TABS
100.0000 mg | ORAL_TABLET | Freq: Every day | ORAL | Status: DC
  Administered 2021-11-05 – 2021-11-06 (×2): 100 mg via ORAL
  Filled 2021-11-04 (×2): qty 1

## 2021-11-04 MED ORDER — SODIUM CHLORIDE 0.9 % IV BOLUS
500.0000 mL | Freq: Once | INTRAVENOUS | Status: AC
  Administered 2021-11-04: 500 mL via INTRAVENOUS

## 2021-11-04 MED ORDER — SODIUM CHLORIDE 0.9 % IV SOLN
3.0000 g | Freq: Once | INTRAVENOUS | Status: AC
  Administered 2021-11-04: 3 g via INTRAVENOUS
  Filled 2021-11-04: qty 8

## 2021-11-04 MED ORDER — AMLODIPINE BESYLATE 10 MG PO TABS
10.0000 mg | ORAL_TABLET | Freq: Every day | ORAL | Status: DC
  Administered 2021-11-06: 10 mg via ORAL
  Filled 2021-11-04: qty 2
  Filled 2021-11-04: qty 1

## 2021-11-04 MED ORDER — HYDRALAZINE HCL 20 MG/ML IJ SOLN: 10.0000 mg | Freq: Three times a day (TID) | INTRAMUSCULAR | Status: DC | PRN

## 2021-11-04 MED ORDER — SODIUM CHLORIDE 0.9 % IV BOLUS
1000.0000 mL | Freq: Once | INTRAVENOUS | Status: AC
  Administered 2021-11-04: 1000 mL via INTRAVENOUS

## 2021-11-04 NOTE — Assessment & Plan Note (Signed)
Will get a1c.  

## 2021-11-04 NOTE — H&P (Addendum)
History and Physical    Chief Complaint: Chest pain/ LLQ abdominal pain / back pain .   HISTORY OF PRESENT ILLNESS: Carlos Contreras is an 66 y.o. male  seen with complaints of midsternal chest pain  and LLQ abd pain and back paint hat is chronic from, DDD.   Abdominal pain: few weeks worse with eating.  Middle of chest.  Pain started : one month ago./ LLQ pain,pt takes too many ibuprofen. He might takes 15 pills of ibuprofen to sleep for his back pain. Chronic Back pain since month x only at night and has sciatica on left leg as well.  Pt is poor historian.   Pt has PMH as below: Past Medical History:  Diagnosis Date   Alcohol dependence (HCC) 05/12/2013   Cocaine dependence with or without physiological dependence (HCC) 05/05/2013   Diabetes mellitus without complication (HCC)    Type II   Hypertension    Iritis    both eyes   Seizures (HCC)     Review Of Systems: Review of Systems  Cardiovascular:  Positive for chest pain.  Gastrointestinal:  Positive for abdominal pain.  Musculoskeletal:  Positive for back pain.  All other systems reviewed and are negative.    ALLERGIES: No Known Allergies  PAST SURGICAL HISTORY: History reviewed. No pertinent surgical history.   SOCIAL HISTORY: Social History   Socioeconomic History   Marital status: Married    Spouse name: Not on file   Number of children: Not on file   Years of education: Not on file   Highest education level: Not on file  Occupational History   Not on file  Tobacco Use   Smoking status: Former   Smokeless tobacco: Never  Substance and Sexual Activity   Alcohol use: No   Drug use: Not Currently    Types: "Crack" cocaine, Marijuana, Cocaine   Sexual activity: Not on file  Other Topics Concern   Not on file  Social History Narrative   Not on file   Social Determinants of Health   Financial Resource Strain: Not on file  Food Insecurity: Not on file  Transportation Needs: Not on file  Physical  Activity: Not on file  Stress: Not on file  Social Connections: Not on file      CURRENT MEDS:   Current Outpatient Medications (Endocrine & Metabolic):    predniSONE (DELTASONE) 10 MG tablet, Take 1 tablet (10 mg total) by mouth daily. 6,5,4,3,2,1 six day taper (Patient not taking: Reported on 11/04/2021)  Current Facility-Administered Medications (Cardiovascular):    amLODipine (NORVASC) tablet 10 mg   hydrALAZINE (APRESOLINE) injection 10 mg  Current Outpatient Medications (Cardiovascular):    amLODipine (NORVASC) 10 MG tablet, Take 10 mg by mouth daily.   hydrochlorothiazide (HYDRODIURIL) 25 MG tablet, Take 25 mg by mouth daily.   lisinopril (ZESTRIL) 10 MG tablet, Take 10 mg by mouth daily.   lisinopril (PRINIVIL,ZESTRIL) 20 MG tablet, Take 20 mg by mouth daily. (Patient not taking: Reported on 11/04/2021)   sildenafil (VIAGRA) 100 MG tablet, Take 1 tablet by mouth. As needed    Current Facility-Administered Medications (Analgesics):    HYDROcodone-acetaminophen (NORCO/VICODIN) 5-325 MG per tablet 1 tablet  Current Outpatient Medications (Analgesics):    HYDROcodone-acetaminophen (NORCO) 5-325 MG tablet, Take 1 tablet by mouth every 4 (four) hours as needed for moderate pain. (Patient not taking: Reported on 11/04/2021)   ibuprofen (ADVIL,MOTRIN) 200 MG tablet, Take 200 mg by mouth every 6 (six) hours as needed.  Current Facility-Administered  Medications (Hematological):    heparin injection 5,000 Units   Current Facility-Administered Medications (Other):    0.9 %  sodium chloride infusion   Ampicillin-Sulbactam (UNASYN) 3 g in sodium chloride 0.9 % 100 mL IVPB   gabapentin (NEURONTIN) capsule 300 mg   lamoTRIgine (LAMICTAL) tablet 100 mg   sodium chloride 0.9 % bolus 1,000 mL   sodium chloride flush (NS) 0.9 % injection 3 mL  Current Outpatient Medications (Other):    lamoTRIgine (LAMICTAL) 100 MG tablet, Take 100 mg by mouth daily.   gabapentin (NEURONTIN) 300 MG  capsule, Take 1 capsule (300 mg total) by mouth at bedtime. (Patient not taking: Reported on 11/04/2021)   Polyethyl Glycol-Propyl Glycol (LUBRICATING EYE DROPS) 0.4-0.3 % SOLN, Apply lubricating eyedrop to left eye as needed for dryness, and just before going to sleep. (Patient not taking: Reported on 11/04/2021)   valACYclovir (VALTREX) 1000 MG tablet, Take 1 tablet (1,000 mg total) by mouth 3 (three) times daily. (Patient not taking: Reported on 11/04/2021)    ED Course: Pt in Ed he is alert,awake and afebrile.  Vitals:   11/04/21 1307 11/04/21 1309 11/04/21 1322 11/04/21 1607  BP: 100/60   98/64  Pulse: 77   72  Resp: 18   20  Temp: 98.3 F (36.8 C)   98.2 F (36.8 C)  TempSrc: Oral     SpO2: 96%   97%  Weight:  79.4 kg 80 kg   Height:  5\' 6"  (1.676 m) 5\' 7"  (1.702 m)    No intake/output data recorded. SpO2: 97 % Blood work in ed shows  hypokalemia / AKI /CT shows diverticulitis.  Results for orders placed or performed during the hospital encounter of 11/04/21 (from the past 24 hour(s))  Basic metabolic panel     Status: Abnormal   Collection Time: 11/04/21  1:17 PM  Result Value Ref Range   Sodium 137 135 - 145 mmol/L   Potassium 3.0 (L) 3.5 - 5.1 mmol/L   Chloride 101 98 - 111 mmol/L   CO2 25 22 - 32 mmol/L   Glucose, Bld 135 (H) 70 - 99 mg/dL   BUN 53 (H) 8 - 23 mg/dL   Creatinine, Ser 11/06/21 (H) 0.61 - 1.24 mg/dL   Calcium 9.8 8.9 - 11/06/21 mg/dL   GFR, Estimated 25 (L) >60 mL/min   Anion gap 11 5 - 15  CBC     Status: None   Collection Time: 11/04/21  1:17 PM  Result Value Ref Range   WBC 7.3 4.0 - 10.5 K/uL   RBC 5.69 4.22 - 5.81 MIL/uL   Hemoglobin 15.0 13.0 - 17.0 g/dL   HCT 17.6 11/06/21 - 16.0 %   MCV 81.4 80.0 - 100.0 fL   MCH 26.4 26.0 - 34.0 pg   MCHC 32.4 30.0 - 36.0 g/dL   RDW 73.7 10.6 - 26.9 %   Platelets 347 150 - 400 K/uL   nRBC 0.0 0.0 - 0.2 %  Troponin I (High Sensitivity)     Status: None   Collection Time: 11/04/21  1:17 PM  Result Value Ref Range    Troponin I (High Sensitivity) 9 <18 ng/L  Hepatic function panel     Status: None   Collection Time: 11/04/21  1:17 PM  Result Value Ref Range   Total Protein 7.6 6.5 - 8.1 g/dL   Albumin 4.0 3.5 - 5.0 g/dL   AST 21 15 - 41 U/L   ALT 12 0 - 44 U/L  Alkaline Phosphatase 61 38 - 126 U/L   Total Bilirubin 0.5 0.3 - 1.2 mg/dL   Bilirubin, Direct <6.6 0.0 - 0.2 mg/dL   Indirect Bilirubin NOT CALCULATED 0.3 - 0.9 mg/dL  Magnesium     Status: None   Collection Time: 11/04/21  1:17 PM  Result Value Ref Range   Magnesium 2.4 1.7 - 2.4 mg/dL  Lipase, blood     Status: None   Collection Time: 11/04/21  1:17 PM  Result Value Ref Range   Lipase 34 11 - 51 U/L  Troponin I (High Sensitivity)     Status: None   Collection Time: 11/04/21  4:08 PM  Result Value Ref Range   Troponin I (High Sensitivity) 7 <18 ng/L   In Ed pt received  Meds ordered this encounter  Medications   sodium chloride 0.9 % bolus 500 mL   famotidine (PEPCID) IVPB 20 mg premix   Ampicillin-Sulbactam (UNASYN) 3 g in sodium chloride 0.9 % 100 mL IVPB    Order Specific Question:   Antibiotic Indication:    Answer:   Intra-abdominal Infection   sodium chloride 0.9 % bolus 1,000 mL   lamoTRIgine (LAMICTAL) tablet 100 mg   HYDROcodone-acetaminophen (NORCO/VICODIN) 5-325 MG per tablet 1 tablet   gabapentin (NEURONTIN) capsule 300 mg   amLODipine (NORVASC) tablet 10 mg   sodium chloride flush (NS) 0.9 % injection 3 mL   0.9 %  sodium chloride infusion   hydrALAZINE (APRESOLINE) injection 10 mg   heparin injection 5,000 Units    Unresulted Labs (From admission, onward)     Start     Ordered   11/05/21 0500  Comprehensive metabolic panel  Tomorrow morning,   STAT        11/04/21 1856   11/05/21 0500  CBC  Tomorrow morning,   STAT        11/04/21 1856   11/04/21 2005  High sensitivity CRP  Once,   R        11/04/21 2004   11/04/21 1900  Ethanol  Once,   R        11/04/21 1900   11/04/21 1856  T4, free  Once,   URGENT         11/04/21 1856   11/04/21 1854  HIV Antibody (routine testing w rflx)  (HIV Antibody (Routine testing w reflex) panel)  Once,   URGENT        11/04/21 1856   11/04/21 1854  Phosphorus  Once,   STAT        11/04/21 1856   11/04/21 1854  TSH  Once,   URGENT        11/04/21 1856   11/04/21 1854  Brain natriuretic peptide  Once,   URGENT        11/04/21 1856   11/04/21 1854  Hemoglobin A1c  Once,   URGENT        11/04/21 1856   11/04/21 1853  Salicylate level  Once,   STAT        11/04/21 1856   11/04/21 1824  Protime-INR  Add-on,   AD        11/04/21 1823   11/04/21 1805  Urinalysis, Routine w reflex microscopic  Once,   URGENT        11/04/21 1804             Admission Imaging : CT ABDOMEN PELVIS WO CONTRAST  Result Date: 11/04/2021 CLINICAL DATA:  Abdominal pain, acute, nonlocalized. EXAM:  CT ABDOMEN AND PELVIS WITHOUT CONTRAST TECHNIQUE: Multidetector CT imaging of the abdomen and pelvis was performed following the standard protocol without IV contrast. RADIATION DOSE REDUCTION: This exam was performed according to the departmental dose-optimization program which includes automated exposure control, adjustment of the mA and/or kV according to patient size and/or use of iterative reconstruction technique. COMPARISON:  None Available. FINDINGS: Lower chest: No acute abnormality. Hepatobiliary: No focal liver abnormality is seen. No gallstones, gallbladder wall thickening, or biliary dilatation. Pancreas: Unremarkable. No pancreatic ductal dilatation or surrounding inflammatory changes. Spleen: Normal in size without focal abnormality. Adrenals/Urinary Tract: Adrenal glands are unremarkable. Kidneys are normal, without renal calculi, focal lesion, or hydronephrosis. Bladder is unremarkable. Stomach/Bowel: Stomach is within normal limits. Appendix appears normal. Colonic diverticulosis. Focal short-segment sigmoid colonic wall thickening with marked adjacent fat stranding consistent  with acute sigmoid colonic diverticulitis. No adjacent fluid collection or abscess. Vascular/Lymphatic: Aortic atherosclerosis. No enlarged abdominal or pelvic lymph nodes. Reproductive: Prostate is unremarkable. Other: No abdominal wall hernia or abnormality. No abdominopelvic ascites. Musculoskeletal: Mild degenerate disc disease of the lumbar spine prominent at L4-L5 and L5-S1. IMPRESSION: 1. Colonic diverticulosis with acute sigmoid colonic diverticulitis without evidence of adjacent fluid collection or abscess. 2.  Mild aortic atherosclerotic calcifications. 3. Degenerate disc disease of the lumbar spine prominent at L4-L5 and L5-S1. Electronically Signed   By: Larose Hires D.O.   On: 11/04/2021 17:59   DG Chest 2 View  Result Date: 11/04/2021 CLINICAL DATA:  Heartburn EXAM: CHEST - 2 VIEW COMPARISON:  12/24/2019 FINDINGS: The heart size and mediastinal contours are within normal limits. Both lungs are clear. The visualized skeletal structures are unremarkable. IMPRESSION: No active cardiopulmonary disease. Electronically Signed   By: Duanne Guess D.O.   On: 11/04/2021 14:35      Physical Examination: Vitals:   11/04/21 1307 11/04/21 1309 11/04/21 1322 11/04/21 1607  BP: 100/60   98/64  Pulse: 77   72  Temp: 98.3 F (36.8 C)   98.2 F (36.8 C)  Resp: 18   20  Height:  5\' 6"  (1.676 m) 5\' 7"  (1.702 m)   Weight:  79.4 kg 80 kg   SpO2: 96%   97%  TempSrc: Oral     BMI (Calculated):  28.26 27.62    Physical Exam Vitals and nursing note reviewed.  Constitutional:      General: He is not in acute distress.    Appearance: Normal appearance. He is not ill-appearing, toxic-appearing or diaphoretic.  HENT:     Head: Normocephalic and atraumatic.     Right Ear: Hearing and external ear normal.     Left Ear: Hearing and external ear normal.     Nose: Nose normal. No nasal deformity.     Mouth/Throat:     Lips: Pink.     Mouth: Mucous membranes are moist.     Tongue: No lesions.      Pharynx: Oropharynx is clear.  Eyes:     Extraocular Movements: Extraocular movements intact.     Pupils: Pupils are equal, round, and reactive to light.  Cardiovascular:     Rate and Rhythm: Normal rate and regular rhythm.     Pulses: Normal pulses.     Heart sounds: Normal heart sounds.  Pulmonary:     Effort: Pulmonary effort is normal.     Breath sounds: Normal breath sounds.    Chest:    Abdominal:     General: Bowel sounds are normal. There is no distension.  Palpations: Abdomen is soft. There is no mass.     Tenderness: There is abdominal tenderness. There is no guarding.     Hernia: No hernia is present.    Musculoskeletal:     Right lower leg: No edema.     Left lower leg: No edema.  Skin:    General: Skin is warm.  Neurological:     General: No focal deficit present.     Mental Status: He is alert and oriented to person, place, and time.     Cranial Nerves: Cranial nerves 2-12 are intact.     Motor: Motor function is intact.  Psychiatric:        Attention and Perception: Attention normal.        Mood and Affect: Mood normal.        Speech: Speech normal.        Behavior: Behavior normal. Behavior is cooperative.        Cognition and Memory: Cognition normal.      Assessment and Plan: * Chest pain midsternal chest pain.  D?D include GERD/ esophagitis related.  Will start IV PPI.  TNI negative x 2. Ekg shows;TWI in inf leads. Which is more pronounced than previous ekg in may.    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 AVOID NSAID's/ IV PPI. No red flag symptoms or band like pain.  Avoid prednisone,.    Abdominal pain Due to diverticulitis. We will cont iv abx.    Seizure [facility]) Seizure precaution. Suspect due to etoh withdrawal in past.  Continue Lamictal.   Type 2 diabetes mellitus without complications ([place]) Will get a1c.   Gastro-esophageal reflux disease without esophagitis IV ppi due to NSAID;s.    Essential (primary)  hypertension Vitals:   11/04/21 1307 11/04/21 1607  BP: 100/60 98/64   Currently pt is hypotensive. We will hold all home bp meds.  Cont with iv hydration,.    DVT prophylaxis:  SCD's    Code Status:  Full code    Family Communication:  Jaquarious, Grey (Spouse)  [phone] (Mobile)   Disposition Plan:  Home.    Consults called:  None   Admission status: Inpatient.    Unit/ Expected LOS: Med tele/ 2 days.     Para Skeans MD [facility]  6 PM- 2 AM. Please contact me via secure Chat 6 PM-2 AM. [phone] ( Pager ) To contact the [facility] Attending or Consulting provider [place] or covering provider during after hours [place], for this patient.   Check the care team in [facility] and look for a) attending/consulting [facility] provider listed and b) the [facility] team listed Log into [email or web address] and use [place]'s universal password to access. If you do not have the password, please contact the hospital operator. Locate the [facility] provider you are looking for under [facility] and page to a number that you can be directly reached. If you still have difficulty reaching the provider, please page the [facility] (Director on Call) for the Hospitalists listed on amion for assistance. [email or web address] 11/04/2021, 8:21 PM

## 2021-11-04 NOTE — ED Provider Notes (Addendum)
[facility] Provider Note    Event Date/Time   First MD Initiated Contact with Patient 11/04/21 1714     (approximate)   History   Heartburn (Chronic , started worse today )   HPI  Carlos Contreras is a 66 y.o. male with history of seizures, diabetes, hypertension and drug use presents emergency department complaining of chronic heartburn and epigastric pain.  No nausea or vomiting.  States he has been taking a lots of NSAIDs for his back pain.  No fever or chills.  States he also has some constipation.  No dark stools.      Physical Exam   Triage Vital Signs: ED Triage Vitals  Enc Vitals Group     BP 11/04/21 1307 100/60     Pulse Rate 11/04/21 1307 77     Resp 11/04/21 1307 18     Temp 11/04/21 1307 98.3 F (36.8 C)     Temp Source 11/04/21 1307 Oral     SpO2 11/04/21 1307 96 %     Weight 11/04/21 1309 175 lb (79.4 kg)     Height 11/04/21 1309 5\' 6"  (1.676 m)     Head Circumference --      Peak Flow --      Pain Score 11/04/21 1321 5     Pain Loc --      Pain Edu? --      Excl. in GC? --     Most recent vital signs: Vitals:   11/04/21 1307 11/04/21 1607  BP: 100/60 98/64  Pulse: 77 72  Resp: 18 20  Temp: 98.3 F (36.8 C) 98.2 F (36.8 C)  SpO2: 96% 97%     General: Awake, no distress.   CV:  Good peripheral perfusion. regular rate and  rhythm Resp:  Normal effort. Lungs cta Abd:  No distention.  Nontender in upper quadrants, tender left lower quadrant Other:     ED Results / Procedures / Treatments   Labs (all labs ordered are listed, but only abnormal results are displayed) Labs Reviewed  BASIC METABOLIC PANEL - Abnormal; Notable for the following components:      Result Value   Potassium 3.0 (*)    Glucose, Bld 135 (*)    BUN 53 (*)    Creatinine, Ser 2.72 (*)    GFR, Estimated 25 (*)    All other components within normal limits  CBC  HEPATIC FUNCTION PANEL  LIPASE, BLOOD  URINALYSIS, ROUTINE W REFLEX  MICROSCOPIC  MAGNESIUM  PROTIME-INR  ETHANOL  TROPONIN I (HIGH SENSITIVITY)  TROPONIN I (HIGH SENSITIVITY)     EKG  EKG   RADIOLOGY CT abdomen/pelvis without contrast    PROCEDURES:   Procedures   MEDICATIONS ORDERED IN ED: Medications  Ampicillin-Sulbactam (UNASYN) 3 g in sodium chloride 0.9 % 100 mL IVPB (has no administration in time range)  sodium chloride 0.9 % bolus 1,000 mL (has no administration in time range)  sodium chloride 0.9 % bolus 500 mL (500 mLs Intravenous New Bag/Given 11/04/21 1813)  famotidine (PEPCID) IVPB 20 mg premix (20 mg Intravenous New Bag/Given 11/04/21 1812)     IMPRESSION / MDM / ASSESSMENT AND PLAN / ED COURSE  I reviewed the triage vital signs and the nursing notes.                              Differential diagnosis includes, but is not limited to, PUD,  GI bleed, MI, gastritis, bowel obstruction, diverticulitis  Patient's presentation is most consistent with acute presentation with potential threat to life or bodily function.   CBC hepatic panel and first troponin are reassuring.  Basic metabolic panel indicates AKI, BUN is 53 and creatinine is 2.72 which is greatly increased from his previous labs 9 months ago  We will get CT abdomen pelvis without contrast due to the AKI to evaluate for diverticulitis, bowel obstruction, gastritis  We will consult hospitalist for admission due to AKI  CT abdomen/pelvis without contrast independently reviewed and interpreted by me as being negative for any acute abnormality, radiologist comments on diverticulosis but diverticulitis is noted at the sigmoid colon..  Consult to hospitalist for NSAID induced AKI and diverticulitis  Dr. Posey Pronto admitting, patient is stable at this time  Chest x-ray independently reviewed and interpreted by me as being negative.  Patient was started on Unasyn and Pepcid and normal saline  FINAL CLINICAL IMPRESSION(S) / ED DIAGNOSES   Final diagnoses:  AKI (acute  kidney injury) ([place])  Acute diverticulitis     Rx / DC Orders   ED Discharge Orders     None        Note:  This document was prepared using Dragon voice recognition software and may include unintentional dictation errors.    Versie Starks, PA-C 11/04/21 1849    Versie Starks, PA-C 11/04/21 1851    Nena Polio, MD 11/04/21 2030

## 2021-11-04 NOTE — ED Notes (Signed)
Attempted recollect of 2nd troponin at this time. Unsuccessful attempt.

## 2021-11-04 NOTE — Assessment & Plan Note (Signed)
Due to diverticulitis. We will cont iv abx.

## 2021-11-04 NOTE — ED Triage Notes (Signed)
Chronic heartburn , symptoms worse today, no nausea, no vomiting , no other complaints

## 2021-11-04 NOTE — Assessment & Plan Note (Signed)
Due to DDD of lumbar spine. AVOID NSAID's/ IV PPI. No red flag symptoms or band like pain.  Avoid prednisone,.

## 2021-11-04 NOTE — Hospital Course (Signed)
Admission request: Abd pain/ Epigastric pain: Ibuprofen ++. AKI Acute Sigmoid diverticulitis.  Lipase : Add On.

## 2021-11-04 NOTE — Assessment & Plan Note (Signed)
Seizure precaution. Suspect due to etoh withdrawal in past.  Continue Lamictal.

## 2021-11-04 NOTE — Assessment & Plan Note (Signed)
IV ppi due to NSAID;s.

## 2021-11-04 NOTE — Assessment & Plan Note (Addendum)
midsternal chest pain.  D?D include GERD/ esophagitis related.  Will start IV PPI.  TNI negative x 2. Ekg shows;TWI in inf leads. Which is more pronounced than previous ekg in may.

## 2021-11-04 NOTE — ED Notes (Signed)
Pt given a [place] sandwich tray and a cup of sprite at this time. Family at bedside. Call light within reach. Pt has no further needs at this time.

## 2021-11-04 NOTE — Assessment & Plan Note (Signed)
Vitals:   11/04/21 1307 11/04/21 1607  BP: 100/60 98/64   Currently pt is hypotensive. We will hold all home bp meds.  Cont with iv hydration,.

## 2021-11-05 ENCOUNTER — Encounter: Payer: Self-pay | Admitting: Internal Medicine

## 2021-11-05 ENCOUNTER — Other Ambulatory Visit: Payer: Self-pay

## 2021-11-05 DIAGNOSIS — R071 Chest pain on breathing: Secondary | ICD-10-CM | POA: Diagnosis not present

## 2021-11-05 LAB — COMPREHENSIVE METABOLIC PANEL
ALT: 11 U/L (ref 0–44)
AST: 16 U/L (ref 15–41)
Albumin: 3.1 g/dL — ABNORMAL LOW (ref 3.5–5.0)
Alkaline Phosphatase: 45 U/L (ref 38–126)
Anion gap: 10 (ref 5–15)
BUN: 36 mg/dL — ABNORMAL HIGH (ref 8–23)
CO2: 22 mmol/L (ref 22–32)
Calcium: 8.5 mg/dL — ABNORMAL LOW (ref 8.9–10.3)
Chloride: 109 mmol/L (ref 98–111)
Creatinine, Ser: 1.59 mg/dL — ABNORMAL HIGH (ref 0.61–1.24)
GFR, Estimated: 48 mL/min — ABNORMAL LOW (ref >60)
Glucose, Bld: 150 mg/dL — ABNORMAL HIGH (ref 70–99)
Potassium: 2.7 mmol/L — CL (ref 3.5–5.1)
Sodium: 141 mmol/L (ref 135–145)
Total Bilirubin: 0.8 mg/dL (ref 0.3–1.2)
Total Protein: 6.2 g/dL — ABNORMAL LOW (ref 6.5–8.1)

## 2021-11-05 LAB — BASIC METABOLIC PANEL
Anion gap: 10 (ref 5–15)
BUN: 24 mg/dL — ABNORMAL HIGH (ref 8–23)
CO2: 22 mmol/L (ref 22–32)
Calcium: 8.6 mg/dL — ABNORMAL LOW (ref 8.9–10.3)
Chloride: 108 mmol/L (ref 98–111)
Creatinine, Ser: 1.32 mg/dL — ABNORMAL HIGH (ref 0.61–1.24)
GFR, Estimated: >60 mL/min (ref >60)
Glucose, Bld: 79 mg/dL (ref 70–99)
Potassium: 3.5 mmol/L (ref 3.5–5.1)
Sodium: 140 mmol/L (ref 135–145)

## 2021-11-05 LAB — CBC
HCT: 37 % — ABNORMAL LOW (ref 39.0–52.0)
Hemoglobin: 12.2 g/dL — ABNORMAL LOW (ref 13.0–17.0)
MCH: 26.9 pg (ref 26.0–34.0)
MCHC: 33 g/dL (ref 30.0–36.0)
MCV: 81.7 fL (ref 80.0–100.0)
Platelets: 279 10*3/uL (ref 150–400)
RBC: 4.53 MIL/uL (ref 4.22–5.81)
RDW: 14.5 % (ref 11.5–15.5)
WBC: 6.5 10*3/uL (ref 4.0–10.5)
nRBC: 0 % (ref 0.0–0.2)

## 2021-11-05 LAB — HIV ANTIBODY (ROUTINE TESTING W REFLEX): HIV Screen 4th Generation wRfx: NONREACTIVE

## 2021-11-05 MED ORDER — INFLUENZA VAC SPLIT QUAD 0.5 ML IM SUSY: 0.5000 mL | PREFILLED_SYRINGE | INTRAMUSCULAR | Status: DC

## 2021-11-05 MED ORDER — PIPERACILLIN-TAZOBACTAM 3.375 G IVPB 30 MIN: 3.3750 g | Freq: Three times a day (TID) | INTRAVENOUS | Status: DC

## 2021-11-05 MED ORDER — ACETAMINOPHEN 325 MG PO TABS
650.0000 mg | ORAL_TABLET | Freq: Four times a day (QID) | ORAL | Status: DC | PRN
  Administered 2021-11-05: 650 mg via ORAL
  Filled 2021-11-05: qty 2

## 2021-11-05 MED ORDER — POTASSIUM CHLORIDE 10 MEQ/100ML IV SOLN
10.0000 meq | INTRAVENOUS | Status: AC
  Administered 2021-11-05 (×2): 10 meq via INTRAVENOUS
  Filled 2021-11-05 (×2): qty 100

## 2021-11-05 MED ORDER — LACTATED RINGERS IV SOLN: INTRAVENOUS | Status: DC

## 2021-11-05 MED ORDER — LAMOTRIGINE 25 MG PO TABS
50.0000 mg | ORAL_TABLET | Freq: Once | ORAL | Status: AC
  Administered 2021-11-05: 50 mg via ORAL
  Filled 2021-11-05: qty 2

## 2021-11-05 MED ORDER — PIPERACILLIN-TAZOBACTAM 3.375 G IVPB
3.3750 g | Freq: Three times a day (TID) | INTRAVENOUS | Status: DC
  Administered 2021-11-05 (×3): 3.375 g via INTRAVENOUS
  Filled 2021-11-05 (×4): qty 50

## 2021-11-05 MED ORDER — POTASSIUM CHLORIDE 10 MEQ/100ML IV SOLN
10.0000 meq | Freq: Once | INTRAVENOUS | Status: AC
  Administered 2021-11-05: 10 meq via INTRAVENOUS
  Filled 2021-11-05: qty 100

## 2021-11-05 MED ORDER — POTASSIUM CHLORIDE CRYS ER 20 MEQ PO TBCR
40.0000 meq | EXTENDED_RELEASE_TABLET | Freq: Once | ORAL | Status: AC
  Administered 2021-11-05: 40 meq via ORAL
  Filled 2021-11-05: qty 2

## 2021-11-05 NOTE — ED Notes (Signed)
Rn aware bed assigned 

## 2021-11-05 NOTE — Progress Notes (Signed)
  Progress Note Patient: Carlos Contreras YPP:[id number] DOB: Jun 21, 1955 DOA: 11/04/2021  DOS: the patient was seen and examined on 11/05/2021  Brief hospital course: 66 year old male with PMH of type II DM, HTN, polysubstance abuse present with complaints of abdominal pain.  Found to have diverticulitis.  Also has chest pain but ischemic work-up was negative.  CT scan shows evidence of acute sigmoid diverticulitis without perforation or abscess.  Assessment and Plan: Sigmoid diverticulitis.  Acute. No perforation or rupture seen on CT scan. Presents with abdominal pain.  Had some diarrhea as well.  Symptoms currently resolved. We will advance liquid diet and monitor. Continue with IV Zosyn. Outpatient colonoscopy on discharge.  Chest pain Patient's primary reason for admission. Troponins are negative.  EKG negative for any ischemia. Monitor.  Essential (primary) hypertension Blood pressure stable. Continue to monitor for now.  Hypokalemia. Hypomagnesemia. Replacing aggressively. Monitor.  GERD. Potentially causing patient's chest pain. Continue PPI.  Seizure ([place]) No active seizures. On gabapentin and Lamictal.  We will continue.  Subjective: Abdominal pain improving.  No nausea no vomiting.  No fever no chills  Physical Exam: Vitals:   11/05/21 0646 11/05/21 0700 11/05/21 1200 11/05/21 1430  BP: 100/76 105/66 92/75 (!) 95/53  Pulse: 76 65 (!) 121 65  Resp: 18 17 (!) 21 20  Temp:   98.7 F (37.1 C)   TempSrc:   Oral   SpO2: 97% 95% 96% 94%  Weight:      Height:       General: Appear in mild distress; no visible Abnormal Neck Mass Or lumps, Conjunctiva normal Cardiovascular: S1 and S2 Present, no Murmur, Respiratory: good respiratory effort, Bilateral Air entry present and CTA, no Crackles, no wheezes Abdomen: Bowel Sound present, diffusely tender. Extremities: no Pedal edema Neurology: alert and oriented to time, place, and person  Gait not checked due to patient  safety concerns   Data Reviewed: I have Reviewed nursing notes, Vitals, and Lab results since pt's last encounter. Pertinent lab results CBC and BMP I have ordered test including CBC and BMP    Family Communication: No one at bedside  Disposition: Status is: Inpatient Remains inpatient appropriate because: Monitor for tolerance of oral diet.  Author: Berle Mull, MD 11/05/2021 5:06 PM  Please look on [email or web address] to find out who is on call.

## 2021-11-06 DIAGNOSIS — K5732 Diverticulitis of large intestine without perforation or abscess without bleeding: Secondary | ICD-10-CM

## 2021-11-06 LAB — CBC
HCT: 35.8 % — ABNORMAL LOW (ref 39.0–52.0)
Hemoglobin: 11.7 g/dL — ABNORMAL LOW (ref 13.0–17.0)
MCH: 26.8 pg (ref 26.0–34.0)
MCHC: 32.7 g/dL (ref 30.0–36.0)
MCV: 82.1 fL (ref 80.0–100.0)
Platelets: 248 10*3/uL (ref 150–400)
RBC: 4.36 MIL/uL (ref 4.22–5.81)
RDW: 14.3 % (ref 11.5–15.5)
WBC: 5.1 10*3/uL (ref 4.0–10.5)
nRBC: 0 % (ref 0.0–0.2)

## 2021-11-06 LAB — LAMOTRIGINE LEVEL: Lamotrigine Lvl: 3.1 ug/mL (ref 2.0–20.0)

## 2021-11-06 LAB — MAGNESIUM: Magnesium: 2 mg/dL (ref 1.7–2.4)

## 2021-11-06 LAB — BASIC METABOLIC PANEL
Anion gap: 6 (ref 5–15)
BUN: 18 mg/dL (ref 8–23)
CO2: 27 mmol/L (ref 22–32)
Calcium: 8.7 mg/dL — ABNORMAL LOW (ref 8.9–10.3)
Chloride: 110 mmol/L (ref 98–111)
Creatinine, Ser: 1.16 mg/dL (ref 0.61–1.24)
GFR, Estimated: >60 mL/min (ref >60)
Glucose, Bld: 91 mg/dL (ref 70–99)
Potassium: 3.3 mmol/L — ABNORMAL LOW (ref 3.5–5.1)
Sodium: 143 mmol/L (ref 135–145)

## 2021-11-06 LAB — HIGH SENSITIVITY CRP: CRP, High Sensitivity: 82.92 mg/L — ABNORMAL HIGH (ref 0.00–3.00)

## 2021-11-06 MED ORDER — TRAMADOL HCL 50 MG PO TABS: 50.0000 mg | ORAL_TABLET | Freq: Four times a day (QID) | ORAL | 0 refills | Status: AC | PRN

## 2021-11-06 MED ORDER — SACCHAROMYCES BOULARDII 250 MG PO CAPS
250.0000 mg | ORAL_CAPSULE | Freq: Two times a day (BID) | ORAL | Status: DC
  Administered 2021-11-06: 250 mg via ORAL
  Filled 2021-11-06: qty 1

## 2021-11-06 MED ORDER — AMOXICILLIN-POT CLAVULANATE 875-125 MG PO TABS: 1.0000 | ORAL_TABLET | Freq: Two times a day (BID) | ORAL | 0 refills | Status: AC

## 2021-11-06 MED ORDER — ORAL CARE MOUTH RINSE: 15.0000 mL | OROMUCOSAL | Status: DC | PRN

## 2021-11-06 MED ORDER — AMOXICILLIN-POT CLAVULANATE 875-125 MG PO TABS
1.0000 | ORAL_TABLET | Freq: Two times a day (BID) | ORAL | Status: DC
  Administered 2021-11-06: 1 via ORAL
  Filled 2021-11-06: qty 1

## 2021-11-06 MED ORDER — SACCHAROMYCES BOULARDII 250 MG PO CAPS: 250.0000 mg | ORAL_CAPSULE | Freq: Two times a day (BID) | ORAL | 0 refills | Status: AC

## 2021-11-06 MED ORDER — POTASSIUM CHLORIDE CRYS ER 20 MEQ PO TBCR
40.0000 meq | EXTENDED_RELEASE_TABLET | ORAL | Status: AC
  Administered 2021-11-06 (×2): 40 meq via ORAL
  Filled 2021-11-06 (×2): qty 2

## 2021-11-06 NOTE — Progress Notes (Signed)
Mobility Specialist - Progress Note   11/06/21 1504  Mobility  Activity Ambulated independently in room;Ambulated independently to bathroom  Level of Assistance Independent  Assistive Device None  Distance Ambulated (ft) 12 ft  Activity Response Tolerated well  $Mobility charge 1 Mobility   [place]ie [place] Mobility Specialist 11/06/21 3:05 PM

## 2021-11-06 NOTE — TOC Initial Note (Signed)
Transition of Care [facility]) - Initial/Assessment Note    Patient Details  Name: Carlos Contreras MRN: [id number] Date of Birth: Aug 06, 1955  Transition of Care [facility]) CM/SW Contact:    Beverly Sessions, RN Phone Number: 11/06/2021, 4:09 PM  Clinical Narrative:                   Transition of Care [facility]) Screening Note   Patient Details  Name: Carlos Contreras Date of Birth: 26-Apr-1955   Transition of Care [facility]) CM/SW Contact:    Beverly Sessions, RN Phone Number: 11/06/2021, 4:09 PM    Transition of Care Department [facility]) has reviewed patient and no TOC needs have been identified at this time. We will continue to monitor patient advancement through interdisciplinary progression rounds. If new patient transition needs arise, please place a TOC consult.         Patient Goals and CMS Choice        Expected Discharge Plan and Services           Expected Discharge Date: 11/06/21                                    Prior Living Arrangements/Services                       Activities of Daily Living Home Assistive Devices/Equipment: None ADL Screening (condition at time of admission) Patient's cognitive ability adequate to safely complete daily activities?: Yes Is the patient deaf or have difficulty hearing?: No Does the patient have difficulty seeing, even when wearing glasses/contacts?: No Does the patient have difficulty concentrating, remembering, or making decisions?: No Patient able to express need for assistance with ADLs?: Yes Does the patient have difficulty dressing or bathing?: No Independently performs ADLs?: Yes (appropriate for developmental age) Does the patient have difficulty walking or climbing stairs?: No Weakness of Legs: None Weakness of Arms/Hands: None  Permission Sought/Granted                  Emotional Assessment              Admission diagnosis:  Epigastric pain [R10.13] AKI (acute kidney injury) ([place])  [N17.9] Acute diverticulitis [K57.92] Patient Active Problem List   Diagnosis Date Noted   Chest pain 11/04/2021   Abdominal pain 11/04/2021   Back pain 11/04/2021   Gastro-esophageal reflux disease without esophagitis 02/21/2014   Cannabis dependence, continuous ([place]) 05/05/2013   Essential (primary) hypertension 12/14/2011   Type 2 diabetes mellitus without complications ([place]) 16/96/7893   Seizure ([place]) 12/14/2011   PCP:  [facility], [place]:   [facility] [address], [place] - [place] [address] Phone: [phone] Fax: [phone]  [place] [address]), [place]) [place] [address] Phone: [phone] Fax: [place], [place] [address] Phone: [phone] Fax: [phone]     Social Determinants of Health (SDOH) Interventions    Readmission Risk Interventions     No data to display

## 2021-11-06 NOTE — Progress Notes (Signed)
   11/06/21 0800  Clinical Encounter Type  Visited With Patient  Visit Type Initial  Referral From Nurse  Consult/Referral To Chaplain   Chaplain responded to nurse consult. Chaplain provided a compassionate presence and reflective listening as patient spoke about health challenges. Chaplain left AD paperwork and Chaplain services are available for follow up if needed.

## 2021-11-06 NOTE — Progress Notes (Signed)
Pt discharged per MD order.  IV removed. Discharge instructions reviewed with pt. Pt verbalized understanding. All questions answered to pt satisfaction. Pt taken out in wheelchair by staff.  

## 2021-11-07 DIAGNOSIS — Z87898 Personal history of other specified conditions: Secondary | ICD-10-CM

## 2021-11-07 DIAGNOSIS — E876 Hypokalemia: Secondary | ICD-10-CM | POA: Diagnosis present

## 2021-11-07 DIAGNOSIS — K5732 Diverticulitis of large intestine without perforation or abscess without bleeding: Secondary | ICD-10-CM | POA: Diagnosis present

## 2021-11-07 NOTE — Discharge Summary (Signed)
Physician Discharge Summary   Patient: Carlos Contreras MRN: [id number] DOB: 31-Jul-1955  Admit date:     11/04/2021  Discharge date: 11/06/2021  Discharge Physician: Berle Mull  PCP: [facility], [place]  Recommendations at discharge: Follow-up with PCP in 1 week. Will require colonoscopy due to diverticulitis.   Follow-up [place], [facility]. Go on 11/13/2021.   Specialty: General Practice Why: Go at 9:00am. Contact information: [place]. [place] [address] [place] on 03/03/2022.   Why: for Gastroenterology consult for colonoscopy. Go at 1:30pm. Contact information: [place] [address] [phone]                Discharge Diagnoses: Principal Problem:   Sigmoid diverticulitis Active Problems:   Chest pain   Essential (primary) hypertension   Gastro-esophageal reflux disease without esophagitis   Type 2 diabetes mellitus without complications (HCC)   Seizure (HCC)   Abdominal pain   Back pain   Hypokalemia   Hypomagnesemia   History of seizures  Hospital Course: 66 year old male with PMH of type II DM, HTN, polysubstance abuse present with complaints of abdominal pain.  Found to have diverticulitis.  Also has chest pain but ischemic work-up was negative.  CT scan shows evidence of acute sigmoid diverticulitis without perforation or abscess. Assessment and Plan  Sigmoid diverticulitis.  Acute. No perforation or rupture seen on CT scan. Presents with abdominal pain.  Had some diarrhea as well.  Symptoms currently resolved. Tolerating soft diet. Treated with IV Zosyn.  We will switch to oral Augmentin. Outpatient colonoscopy on discharge.   Chest pain Patient's primary reason for admission. Troponins are negative.  EKG negative for any ischemia.   Essential (primary) hypertension Blood pressure stable.    Hypokalemia. Hypomagnesemia. Replacing aggressively. Monitor.   GERD. Potentially causing patient's chest pain. Continue PPI.   History of seizure ([place]) No active seizures. On gabapentin and Lamictal.  We will continue.   Consultants:  none  Procedures performed:  none  DISCHARGE MEDICATION: Allergies as of 11/06/2021   No Known Allergies      Medication List     STOP taking these medications    gabapentin 300 MG capsule Commonly known as: Neurontin   hydrochlorothiazide 25 MG tablet Commonly known as: HYDRODIURIL   HYDROcodone-acetaminophen 5-325 MG tablet Commonly known as: Norco   ibuprofen 200 MG tablet Commonly known as: ADVIL   Lubricating Eye Drops 0.4-0.3 % Soln Generic drug: Polyethyl Glycol-Propyl Glycol   predniSONE 10 MG tablet Commonly known as: DELTASONE   valACYclovir 1000 MG tablet Commonly known as: Valtrex       TAKE these medications    amLODipine 10 MG tablet Commonly known as: NORVASC Take 10 mg by mouth daily.   amoxicillin-clavulanate 875-125 MG tablet Commonly known as: AUGMENTIN Take 1 tablet by mouth 2 (two) times daily for 7 days.   lamoTRIgine 100 MG tablet Commonly known as: LAMICTAL Take 100 mg by mouth daily.   lisinopril 10 MG tablet Commonly known as: ZESTRIL Take 10 mg by mouth daily. What changed: Another medication with the same name was removed. Continue taking this medication, and follow the directions you see here.   saccharomyces boulardii 250 MG capsule Commonly known as: FLORASTOR Take 1 capsule (250 mg total) by mouth 2 (two) times daily for 7 days.   sildenafil 100 MG tablet Commonly known  as: VIAGRA Take 1 tablet by mouth. As needed   traMADol 50 MG tablet Commonly known as: Ultram Take 1 tablet (50 mg total) by mouth every 6 (six) hours as needed for moderate pain or severe pain.       Disposition: Home Diet recommendation: Cardiac diet  Discharge Exam: Vitals:   11/05/21 1430  11/05/21 1851 11/06/21 0343 11/06/21 0921  BP: (!) 95/53 (!) 142/74 123/80 (!) 138/91  Pulse: 65 65 60 (!) 56  Resp: 20 15 19 18   Temp:  98.4 F (36.9 C) 98.2 F (36.8 C) 98 F (36.7 C)  TempSrc:  Oral    SpO2: 94% 98% 99% 100%  Weight:      Height:       General: Appear in no distress; no visible Abnormal Neck Mass Or lumps, Conjunctiva normal Cardiovascular: S1 and S2 Present, no Murmur, Respiratory: good respiratory effort, Bilateral Air entry present and CTA, no Crackles, no wheezes Abdomen: Bowel Sound present, no Extremities: no Pedal edema Neurology: alert and oriented to time, place, and person  Filed Weights   11/04/21 1309 11/04/21 1322  Weight: 79.4 kg 80 kg   Condition at discharge: stable  The results of significant diagnostics from this hospitalization (including imaging, microbiology, ancillary and laboratory) are listed below for reference.   Imaging Studies: CT ABDOMEN PELVIS WO CONTRAST  Result Date: 11/04/2021 CLINICAL DATA:  Abdominal pain, acute, nonlocalized. EXAM: CT ABDOMEN AND PELVIS WITHOUT CONTRAST TECHNIQUE: Multidetector CT imaging of the abdomen and pelvis was performed following the standard protocol without IV contrast. RADIATION DOSE REDUCTION: This exam was performed according to the departmental dose-optimization program which includes automated exposure control, adjustment of the mA and/or kV according to patient size and/or use of iterative reconstruction technique. COMPARISON:  None Available. FINDINGS: Lower chest: No acute abnormality. Hepatobiliary: No focal liver abnormality is seen. No gallstones, gallbladder wall thickening, or biliary dilatation. Pancreas: Unremarkable. No pancreatic ductal dilatation or surrounding inflammatory changes. Spleen: Normal in size without focal abnormality. Adrenals/Urinary Tract: Adrenal glands are unremarkable. Kidneys are normal, without renal calculi, focal lesion, or hydronephrosis. Bladder is unremarkable.  Stomach/Bowel: Stomach is within normal limits. Appendix appears normal. Colonic diverticulosis. Focal short-segment sigmoid colonic wall thickening with marked adjacent fat stranding consistent with acute sigmoid colonic diverticulitis. No adjacent fluid collection or abscess. Vascular/Lymphatic: Aortic atherosclerosis. No enlarged abdominal or pelvic lymph nodes. Reproductive: Prostate is unremarkable. Other: No abdominal wall hernia or abnormality. No abdominopelvic ascites. Musculoskeletal: Mild degenerate disc disease of the lumbar spine prominent at L4-L5 and L5-S1. IMPRESSION: 1. Colonic diverticulosis with acute sigmoid colonic diverticulitis without evidence of adjacent fluid collection or abscess. 2.  Mild aortic atherosclerotic calcifications. 3. Degenerate disc disease of the lumbar spine prominent at L4-L5 and L5-S1. Electronically Signed   By: Keane Police D.O.   On: 11/04/2021 17:59   DG Chest 2 View  Result Date: 11/04/2021 CLINICAL DATA:  Heartburn EXAM: CHEST - 2 VIEW COMPARISON:  12/24/2019 FINDINGS: The heart size and mediastinal contours are within normal limits. Both lungs are clear. The visualized skeletal structures are unremarkable. IMPRESSION: No active cardiopulmonary disease. Electronically Signed   By: Davina Poke D.O.   On: 11/04/2021 14:35    Microbiology: Results for orders placed or performed during the hospital encounter of 12/24/19  Respiratory Panel by RT PCR (Flu A&B, Covid) - Nasopharyngeal Swab     Status: None   Collection Time: 12/24/19 11:44 AM   Specimen: Nasopharyngeal Swab  Result Value Ref Range Status  SARS Coronavirus 2 by RT PCR NEGATIVE NEGATIVE Final    Comment: (NOTE) SARS-CoV-2 target nucleic acids are NOT DETECTED.  The SARS-CoV-2 RNA is generally detectable in upper respiratoy specimens during the acute phase of infection. The lowest concentration of SARS-CoV-2 viral copies this assay can detect is 131 copies/mL. A negative result does  not preclude SARS-Cov-2 infection and should not be used as the sole basis for treatment or other patient management decisions. A negative result may occur with  improper specimen collection/handling, submission of specimen other than nasopharyngeal swab, presence of viral mutation(s) within the areas targeted by this assay, and inadequate number of viral copies (<131 copies/mL). A negative result must be combined with clinical observations, patient history, and epidemiological information. The expected result is Negative.  Fact Sheet for Patients:  PinkCheek.be  Fact Sheet for Healthcare Providers:  GravelBags.it  This test is no t yet approved or cleared by the [place] FDA and  has been authorized for detection and/or diagnosis of SARS-CoV-2 by FDA under an Emergency Use Authorization (EUA). This EUA will remain  in effect (meaning this test can be used) for the duration of the COVID-19 declaration under Section 564(b)(1) of the Act, 21 U.S.C. section 360bbb-3(b)(1), unless the authorization is terminated or revoked sooner.     Influenza A by PCR NEGATIVE NEGATIVE Final   Influenza B by PCR NEGATIVE NEGATIVE Final    Comment: (NOTE) The Xpert Xpress SARS-CoV-2/FLU/RSV assay is intended as an aid in  the diagnosis of influenza from Nasopharyngeal swab specimens and  should not be used as a sole basis for treatment. Nasal washings and  aspirates are unacceptable for Xpert Xpress SARS-CoV-2/FLU/RSV  testing.  Fact Sheet for Patients: PinkCheek.be  Fact Sheet for Healthcare Providers: GravelBags.it  This test is not yet approved or cleared by the [place] FDA and  has been authorized for detection and/or diagnosis of SARS-CoV-2 by  FDA under an Emergency Use Authorization (EUA). This EUA will remain  in effect (meaning this test can be used) for the  duration of the  Covid-19 declaration under Section 564(b)(1) of the Act, 21  U.S.C. section 360bbb-3(b)(1), unless the authorization is  terminated or revoked. Performed at [facility], [address] [place], [place] [address]    Labs: CBC: Recent Labs  Lab 11/04/21 1317 11/05/21 0524 11/06/21 0535  WBC 7.3 6.5 5.1  HGB 15.0 12.2* 11.7*  HCT 46.3 37.0* 35.8*  MCV 81.4 81.7 82.1  PLT 347 279 784   Basic Metabolic Panel: Recent Labs  Lab 11/04/21 1317 11/04/21 2050 11/05/21 0524 11/05/21 1800 11/06/21 0535  NA 137  --  141 140 143  K 3.0*  --  2.7* 3.5 3.3*  CL 101  --  109 108 110  CO2 25  --  22 22 27   GLUCOSE 135*  --  150* 79 91  BUN 53*  --  36* 24* 18  CREATININE 2.72*  --  1.59* 1.32* 1.16  CALCIUM 9.8  --  8.5* 8.6* 8.7*  MG 2.4  --   --   --  2.0  PHOS  --  3.4  --   --   --    Liver Function Tests: Recent Labs  Lab 11/04/21 1317 11/05/21 0524  AST 21 16  ALT 12 11  ALKPHOS 61 45  BILITOT 0.5 0.8  PROT 7.6 6.2*  ALBUMIN 4.0 3.1*   CBG: No results for input(s): "GLUCAP" in the last 168 hours.  Discharge  time spent: greater than 30 minutes.  Signed: Berle Mull, MD [facility] 11/06/2021

## 2023-01-25 ENCOUNTER — Other Ambulatory Visit
Admission: RE | Admit: 2023-01-25 | Discharge: 2023-01-25 | Disposition: A | Discharge status: Home / Self Care | Payer: 59 | Source: Ambulatory Visit | Attending: Family Medicine | Admitting: Family Medicine

## 2023-01-25 DIAGNOSIS — F1410 Cocaine abuse, uncomplicated: Secondary | ICD-10-CM | POA: Diagnosis present

## 2023-01-25 DIAGNOSIS — R079 Chest pain, unspecified: Secondary | ICD-10-CM | POA: Diagnosis present

## 2023-01-25 DIAGNOSIS — R9431 Abnormal electrocardiogram [ECG] [EKG]: Secondary | ICD-10-CM | POA: Insufficient documentation

## 2023-01-25 LAB — TROPONIN I (HIGH SENSITIVITY): Troponin I (High Sensitivity): 5 ng/L (ref <18)

## 2023-01-26 ENCOUNTER — Other Ambulatory Visit: Payer: Self-pay | Admitting: Cardiology

## 2023-01-26 ENCOUNTER — Telehealth (HOSPITAL_COMMUNITY): Payer: Self-pay | Admitting: *Deleted

## 2023-01-26 DIAGNOSIS — R0789 Other chest pain: Secondary | ICD-10-CM

## 2023-01-26 DIAGNOSIS — I10 Essential (primary) hypertension: Secondary | ICD-10-CM

## 2023-01-26 NOTE — Telephone Encounter (Signed)
Reaching out to patient to offer assistance regarding upcoming cardiac imaging study; pt verbalizes understanding of appt date/time, parking situation and where to check in, pre-test NPO status and medications ordered, and verified current allergies; name and call back number provided for further questions should they arise Hayley Sharpe RN Navigator Cardiac Imaging [facility] [phone] office [phone] cell  

## 2023-01-27 ENCOUNTER — Ambulatory Visit
Admission: RE | Admit: 2023-01-27 | Discharge: 2023-01-27 | Disposition: A | Discharge status: Home / Self Care | Payer: 59 | Source: Ambulatory Visit | Attending: Cardiology | Admitting: Cardiology

## 2023-01-27 DIAGNOSIS — I10 Essential (primary) hypertension: Secondary | ICD-10-CM | POA: Diagnosis present

## 2023-01-27 DIAGNOSIS — R0789 Other chest pain: Secondary | ICD-10-CM | POA: Diagnosis present

## 2023-01-27 DIAGNOSIS — I2510 Atherosclerotic heart disease of native coronary artery without angina pectoris: Secondary | ICD-10-CM | POA: Insufficient documentation

## 2023-01-27 MED ORDER — IOHEXOL 350 MG/ML SOLN
80.0000 mL | Freq: Once | INTRAVENOUS | Status: AC | PRN
  Administered 2023-01-27: 80 mL via INTRAVENOUS

## 2023-01-27 MED ORDER — NITROGLYCERIN 0.4 MG SL SUBL
0.8000 mg | SUBLINGUAL_TABLET | Freq: Once | SUBLINGUAL | Status: AC
  Administered 2023-01-27: 0.8 mg via SUBLINGUAL

## 2023-01-27 NOTE — Progress Notes (Signed)
Pt tolerated procedure well with no issues. Pt ABCs intact. Pt denies any complaints. Pt encouraged to drink plenty of water throughout the day. Pt ambulatory with steady gait.

## 2023-03-01 ENCOUNTER — Other Ambulatory Visit: Payer: Self-pay

## 2023-03-01 ENCOUNTER — Emergency Department
Admission: EM | Admit: 2023-03-01 | Discharge: 2023-03-01 | Disposition: A | Discharge status: Home / Self Care | Payer: 59 | Attending: Emergency Medicine | Admitting: Emergency Medicine

## 2023-03-01 DIAGNOSIS — Z0000 Encounter for general adult medical examination without abnormal findings: Secondary | ICD-10-CM | POA: Diagnosis present

## 2023-03-01 NOTE — Discharge Instructions (Signed)
 Please call your primary care provider and set up an outpatient appointment to address some of your chronic concerns for further cancer evaluation.

## 2023-03-01 NOTE — ED Provider Notes (Signed)
 [facility] Provider Note    Event Date/Time   First MD Initiated Contact with Patient 03/01/23 [phone]     (approximate)   History   Follow-up   HPI Carlos Contreras is a 68 y.o. male with polysubstance use disorder presenting today for checkup.  Patient states that he has intermittent marijuana and crack cocaine usage.  He was worried about possibly having lung cancer and wanted to get checked out.  Today he denies cough, chest pain, shortness of breath, fever, chills, weight gain or weight loss.  Denies any other symptoms.  No pain in the mouth or nausea.  No prior history of cancer.  Reviewed most recent outpatient CT imaging.     Physical Exam   Triage Vital Signs: ED Triage Vitals  Encounter Vitals Group     BP 03/01/23 0846 (!) 144/97     Systolic BP Percentile --      Diastolic BP Percentile --      Pulse Rate 03/01/23 0846 (!) 52     Resp 03/01/23 0846 18     Temp 03/01/23 0846 98.2 F (36.8 C)     Temp Source 03/01/23 0846 Oral     SpO2 03/01/23 0846 96 %     Weight 03/01/23 0853 170 lb (77.1 kg)     Height 03/01/23 0853 5' 6 (1.676 m)     Head Circumference --      Peak Flow --      Pain Score 03/01/23 0853 0     Pain Loc --      Pain Education --      Exclude from Growth Chart --     Most recent vital signs: Vitals:   03/01/23 0846  BP: (!) 144/97  Pulse: (!) 52  Resp: 18  Temp: 98.2 F (36.8 C)  SpO2: 96%   I have reviewed the vital signs. General:  Awake, alert, no acute distress. Head:  Normocephalic, Atraumatic. EENT:  PERRL, EOMI, Oral mucosa pink and moist, Neck is supple. Cardiovascular: Regular rate, 2+ distal pulses. Respiratory:  Normal respiratory effort, symmetrical expansion, no distress.   Extremities:  Moving all four extremities through full ROM without pain.   Neuro:  Alert and oriented.  Interacting appropriately.   Skin:  Warm, dry, no rash.   Psych: Appropriate affect.    ED Results / Procedures /  Treatments   Labs (all labs ordered are listed, but only abnormal results are displayed) Labs Reviewed - No data to display   EKG    RADIOLOGY    PROCEDURES:  Critical Care performed: No  Procedures   MEDICATIONS ORDERED IN ED: Medications - No data to display   IMPRESSION / MDM / ASSESSMENT AND PLAN / ED COURSE  I reviewed the triage vital signs and the nursing notes.                              Differential diagnosis includes, but is not limited to, checkup, low suspicion lung cancer  Patient's presentation is most consistent with acute, uncomplicated illness.  Patient is a 68 year old male with polysubstance use disorder presenting today for checkup.  Patient notes history of chronic marijuana and crack cocaine usage.  Denies any tobacco usage.  States he wanted to get checked out for cancer.  Does not have any acute symptoms at this time and vital signs otherwise stable.  Did have recent CT coronary which was  unremarkable.  We discussed whether additional chest x-ray would be beneficial, however reviewing the images, most of the pleural space was captured on the CT.  Do not think emergent chest x-ray needed at this time.  Given that he is completely asymptomatic, discussed further outpatient workup with his PCP would be more appropriate.  He is agreeable with this plan.  Given strict return precautions should he have any new onset of symptoms.     FINAL CLINICAL IMPRESSION(S) / ED DIAGNOSES   Final diagnoses:  Routine check-up     Rx / DC Orders   ED Discharge Orders     None        Note:  This document was prepared using Dragon voice recognition software and may include unintentional dictation errors.   Malvina Alm DASEN, MD 03/01/23 [phone]

## 2023-03-01 NOTE — ED Triage Notes (Signed)
 Pt to ED "to get checked out". Reports is not feeling bad right now, just feels bad after smoking crack, but feels ok after smoking weed. States "cancer is so sneaky, just thought id get checked out". Denies any complaints at this time, NAD noted

## 2023-04-04 ENCOUNTER — Other Ambulatory Visit: Payer: Self-pay

## 2023-04-04 ENCOUNTER — Emergency Department: Payer: 59

## 2023-04-04 ENCOUNTER — Emergency Department
Admission: EM | Admit: 2023-04-04 | Discharge: 2023-04-04 | Disposition: A | Discharge status: Home / Self Care | Payer: 59 | Attending: Emergency Medicine | Admitting: Emergency Medicine

## 2023-04-04 DIAGNOSIS — Z79899 Other long term (current) drug therapy: Secondary | ICD-10-CM | POA: Insufficient documentation

## 2023-04-04 DIAGNOSIS — Z7984 Long term (current) use of oral hypoglycemic drugs: Secondary | ICD-10-CM | POA: Diagnosis not present

## 2023-04-04 DIAGNOSIS — R0602 Shortness of breath: Secondary | ICD-10-CM | POA: Insufficient documentation

## 2023-04-04 DIAGNOSIS — M549 Dorsalgia, unspecified: Secondary | ICD-10-CM | POA: Insufficient documentation

## 2023-04-04 DIAGNOSIS — I10 Essential (primary) hypertension: Secondary | ICD-10-CM | POA: Insufficient documentation

## 2023-04-04 DIAGNOSIS — E119 Type 2 diabetes mellitus without complications: Secondary | ICD-10-CM | POA: Insufficient documentation

## 2023-04-04 LAB — CBC
HCT: 43.8 % (ref 39.0–52.0)
Hemoglobin: 14 g/dL (ref 13.0–17.0)
MCH: 27 pg (ref 26.0–34.0)
MCHC: 32 g/dL (ref 30.0–36.0)
MCV: 84.4 fL (ref 80.0–100.0)
Platelets: 254 10*3/uL (ref 150–400)
RBC: 5.19 MIL/uL (ref 4.22–5.81)
RDW: 14.1 % (ref 11.5–15.5)
WBC: 4.5 10*3/uL (ref 4.0–10.5)
nRBC: 0 % (ref 0.0–0.2)

## 2023-04-04 LAB — D-DIMER, QUANTITATIVE: D-Dimer, Quant: 0.3 ug{FEU}/mL (ref 0.00–0.50)

## 2023-04-04 LAB — BASIC METABOLIC PANEL
Anion gap: 8 (ref 5–15)
BUN: 16 mg/dL (ref 8–23)
CO2: 27 mmol/L (ref 22–32)
Calcium: 9.1 mg/dL (ref 8.9–10.3)
Chloride: 106 mmol/L (ref 98–111)
Creatinine, Ser: 1.24 mg/dL (ref 0.61–1.24)
GFR, Estimated: >60 mL/min (ref >60)
Glucose, Bld: 91 mg/dL (ref 70–99)
Potassium: 4.2 mmol/L (ref 3.5–5.1)
Sodium: 141 mmol/L (ref 135–145)

## 2023-04-04 LAB — TROPONIN I (HIGH SENSITIVITY)
Troponin I (High Sensitivity): 6 ng/L (ref <18)
Troponin I (High Sensitivity): 5 ng/L (ref <18)

## 2023-04-04 NOTE — ED Provider Notes (Signed)
[facility] Provider Note    Event Date/Time   First MD Initiated Contact with Patient 04/04/23 [phone]     (approximate)   History   Shortness of Breath   HPI  Carlos Contreras is a 68 y.o. male with history of hypertension, diabetes, hyperlipidemia, cocaine and alcohol use disorder, mild obstructive CAD seen on cardiac imaging who presents to the emergency department with complaints of upper back pain, shortness of breath that started tonight.  States the symptoms have improved.  No aggravating relieving factors.  Denies any chest pain.  No history of PE, DVT, exogenous estrogen use, recent fractures, surgery, trauma, hospitalization, prolonged travel or other immobilization. No lower extremity swelling or pain. No calf tenderness.  States he has had the symptoms previously.  He does have a cardiologist, last saw them in January.  He states at that time he was put on a new medication which he felt like it worsened his shortness of breath.  Looks like he was started on atorvastatin.  Patient is unable to tell me what the medication was called.  He denies any fevers, cough.   History provided by patient, limited as he is a poor historian.    Past Medical History:  Diagnosis Date   Alcohol dependence (HCC) 05/12/2013   Cocaine dependence with or without physiological dependence (HCC) 05/05/2013   Diabetes mellitus without complication (HCC)    Type II   Hypertension    Iritis    both eyes   Seizures (HCC)     History reviewed. No pertinent surgical history.  MEDICATIONS:  Prior to Admission medications   Medication Sig Start Date End Date Taking? Authorizing Provider  amLODipine (NORVASC) 10 MG tablet Take 10 mg by mouth daily.    [provider]  lamoTRIgine (LAMICTAL) 100 MG tablet Take 100 mg by mouth daily.    [provider]  lisinopril (ZESTRIL) 10 MG tablet Take 10 mg by mouth daily. 11/03/21   [provider]  sildenafil  (VIAGRA) 100 MG tablet Take 1 tablet by mouth. As needed 10/17/21   [provider]    Physical Exam   Triage Vital Signs: ED Triage Vitals  Encounter Vitals Group     BP 04/04/23 0203 120/63     Systolic BP Percentile --      Diastolic BP Percentile --      Pulse Rate 04/04/23 0203 (!) 50     Resp 04/04/23 0203 14     Temp 04/04/23 0203 98.1 F (36.7 C)     Temp Source 04/04/23 0203 Oral     SpO2 04/04/23 0203 98 %     Weight 04/04/23 0201 175 lb (79.4 kg)     Height 04/04/23 0201 5\' 6"  (1.676 m)     Head Circumference --      Peak Flow --      Pain Score 04/04/23 0201 5     Pain Loc --      Pain Education --      Exclude from Growth Chart --     Most recent vital signs: Vitals:   04/04/23 0203  BP: 120/63  Pulse: (!) 50  Resp: 14  Temp: 98.1 F (36.7 C)  SpO2: 98%    CONSTITUTIONAL: Alert, responds appropriately to questions. Well-appearing; well-nourished HEAD: Normocephalic, atraumatic EYES: Conjunctivae clear, pupils appear equal, sclera nonicteric ENT: normal nose; moist mucous membranes NECK: Supple, normal ROM CARD: Regular and bradycardic; S1 and S2 appreciated RESP: Normal  chest excursion without splinting or tachypnea; breath sounds clear and equal bilaterally; no wheezes, no rhonchi, no rales, no hypoxia or respiratory distress, speaking full sentences ABD/GI: Non-distended; soft, non-tender, no rebound, no guarding, no peritoneal signs BACK: The back appears normal, upper back nontender to palpation, no midline spinal tenderness or step-off or deformity EXT: Normal ROM in all joints; no deformity noted, no edema, no calf tenderness or calf swelling SKIN: Normal color for age and race; warm; no rash on exposed skin NEURO: Moves all extremities equally, normal speech PSYCH: The patient's mood and manner are appropriate.   ED Results / Procedures / Treatments   LABS: (all labs ordered are listed, but only abnormal results are displayed) Labs  Reviewed  BASIC METABOLIC PANEL  CBC  D-DIMER, QUANTITATIVE  TROPONIN I (HIGH SENSITIVITY)  TROPONIN I (HIGH SENSITIVITY)     EKG:  EKG Interpretation Date/Time:  Sunday April 04 2023 02:06:28 EST Ventricular Rate:  46 PR Interval:  200 QRS Duration:  84 QT Interval:  420 QTC Calculation: 367 R Axis:   32  Text Interpretation: Sinus bradycardia Otherwise normal ECG When compared with ECG of 04-Nov-2021 13:12, Vent. rate has decreased BY  29 BPM Non-specific change in ST segment in Inferior leads T wave inversion no longer evident in Inferior leads Nonspecific T wave abnormality now evident in Lateral leads QT has shortened Confirmed by Rochele Raring [phone] on 04/04/2023 5:11:07 AM         RADIOLOGY: My personal review and interpretation of imaging: Chest x-ray clear.  No widened mediastinum, cardiomegaly, infiltrate or edema.  I have personally reviewed all radiology reports.   DG Chest 2 View Result Date: 04/04/2023 CLINICAL DATA:  Chest pain. EXAM: CHEST - 2 VIEW COMPARISON:  Chest x-ray 11/04/2021. FINDINGS: Low lung volumes. The heart size and mediastinal contours are within normal limits. Both lungs are clear. No visible pleural effusions or pneumothorax. No acute osseous abnormality. IMPRESSION: No active cardiopulmonary disease. Electronically Signed   By: Feliberto Harts M.D.   On: 04/04/2023 02:28     PROCEDURES:  Critical Care performed: No     .1-3 Lead EKG Interpretation  Performed by: Lilian Fuhs, Layla Maw, DO Authorized by: Maelani Yarbro, Layla Maw, DO     Interpretation: abnormal     ECG rate:  50   ECG rate assessment: bradycardic     Rhythm: sinus bradycardia     Ectopy: none     Conduction: normal       IMPRESSION / MDM / ASSESSMENT AND PLAN / ED COURSE  I reviewed the triage vital signs and the nursing notes.    Patient here with upper back pain, shortness of breath.  History of cocaine use, mild obstructive CAD on cardiac CT.  The patient is on  the cardiac monitor to evaluate for evidence of arrhythmia and/or significant heart rate changes.   DIFFERENTIAL DIAGNOSIS (includes but not limited to):   ACS, PE, dissection, esophagitis, esophageal spasm, musculoskeletal back pain, pneumonia, pneumothorax, CHF   Patient's presentation is most consistent with acute presentation with potential threat to life or bodily function.   PLAN: Workup initiated from triage.  Normal hemoglobin, electrolytes, creatinine.  First troponin negative.  Second pending.  Will also obtain D-dimer to rule out PE, dissection.  Chest x-ray reviewed and interpreted by myself and the radiologist and shows no edema, infiltrate, widened mediastinum, cardiomegaly.  Patient reports he is feeling better.  His EKG shows no new ischemic change.   MEDICATIONS GIVEN IN  ED: Medications - No data to display   ED COURSE: Second troponin negative.  D-dimer negative.  Low suspicion for PE, dissection.  Patient had a negative CT angio of his chest, abdomen and pelvis in December 2024.  No arrhythmia seen on cardiac monitoring.  Patient remains hemodynamically stable.  Advised him to follow-up with his PCP and cardiologist.  I feel he is safe for discharge.   At this time, I do not feel there is any life-threatening condition present. I reviewed all nursing notes, vitals, pertinent previous records.  All lab and urine results, EKGs, imaging ordered have been independently reviewed and interpreted by myself.  I reviewed all available radiology reports from any imaging ordered this visit.  Based on my assessment, I feel the patient is safe to be discharged home without further emergent workup and can continue workup as an outpatient as needed. Discussed all findings, treatment plan as well as usual and customary return precautions.  They verbalize understanding and are comfortable with this plan.  Outpatient follow-up has been provided as needed.  All questions have been  answered.   CONSULTS: Admission considered but symptoms have resolved and are somewhat atypical for ACS with reassuring workup here today.  He has a cardiologist for outpatient follow-up.   OUTSIDE RECORDS REVIEWED: Reviewed recent cardiology note.       FINAL CLINICAL IMPRESSION(S) / ED DIAGNOSES   Final diagnoses:  Shortness of breath  Upper back pain     Rx / DC Orders   ED Discharge Orders     None        Note:  This document was prepared using Dragon voice recognition software and may include unintentional dictation errors.   Waymond Meador, Layla Maw, DO 04/04/23 [phone]

## 2023-04-04 NOTE — Discharge Instructions (Addendum)
Your cardiac labs, EKG, chest x-ray were reassuring today.  Please follow-up with your primary care doctor and cardiologist.

## 2023-04-04 NOTE — ED Triage Notes (Signed)
Pt to ed from home via POV for SOB and CP that woke him up. Pt is caox4, in no acute distress and ambulatory in triage. Pt also has some back pain.

## 2023-04-26 ENCOUNTER — Emergency Department
Admission: EM | Admit: 2023-04-26 | Discharge: 2023-04-26 | Discharge status: Against Medical Advice | Attending: Emergency Medicine | Admitting: Emergency Medicine

## 2023-04-26 ENCOUNTER — Emergency Department

## 2023-04-26 ENCOUNTER — Other Ambulatory Visit: Payer: Self-pay

## 2023-04-26 ENCOUNTER — Ambulatory Visit: Payer: 59

## 2023-04-26 DIAGNOSIS — Z5321 Procedure and treatment not carried out due to patient leaving prior to being seen by health care provider: Secondary | ICD-10-CM | POA: Diagnosis not present

## 2023-04-26 DIAGNOSIS — R0902 Hypoxemia: Secondary | ICD-10-CM | POA: Insufficient documentation

## 2023-04-26 DIAGNOSIS — R1319 Other dysphagia: Secondary | ICD-10-CM | POA: Diagnosis not present

## 2023-04-26 DIAGNOSIS — J029 Acute pharyngitis, unspecified: Secondary | ICD-10-CM | POA: Diagnosis not present

## 2023-04-26 DIAGNOSIS — R918 Other nonspecific abnormal finding of lung field: Secondary | ICD-10-CM | POA: Diagnosis not present

## 2023-04-26 DIAGNOSIS — K219 Gastro-esophageal reflux disease without esophagitis: Secondary | ICD-10-CM | POA: Diagnosis not present

## 2023-04-26 DIAGNOSIS — R059 Cough, unspecified: Secondary | ICD-10-CM | POA: Insufficient documentation

## 2023-04-26 DIAGNOSIS — K449 Diaphragmatic hernia without obstruction or gangrene: Secondary | ICD-10-CM | POA: Diagnosis not present

## 2023-04-26 LAB — CBC WITH DIFFERENTIAL/PLATELET
Abs Immature Granulocytes: 0.01 10*3/uL (ref 0.00–0.07)
Basophils Absolute: 0 10*3/uL (ref 0.0–0.1)
Basophils Relative: 0 %
Eosinophils Absolute: 0 10*3/uL (ref 0.0–0.5)
Eosinophils Relative: 0 %
HCT: 44.2 % (ref 39.0–52.0)
Hemoglobin: 14.1 g/dL (ref 13.0–17.0)
Immature Granulocytes: 0 %
Lymphocytes Relative: 13 %
Lymphs Abs: 0.7 10*3/uL (ref 0.7–4.0)
MCH: 26.9 pg (ref 26.0–34.0)
MCHC: 31.9 g/dL (ref 30.0–36.0)
MCV: 84.2 fL (ref 80.0–100.0)
Monocytes Absolute: 0.3 10*3/uL (ref 0.1–1.0)
Monocytes Relative: 6 %
Neutro Abs: 4.4 10*3/uL (ref 1.7–7.7)
Neutrophils Relative %: 81 %
Platelets: 253 10*3/uL (ref 150–400)
RBC: 5.25 MIL/uL (ref 4.22–5.81)
RDW: 14.2 % (ref 11.5–15.5)
WBC: 5.4 10*3/uL (ref 4.0–10.5)
nRBC: 0 % (ref 0.0–0.2)

## 2023-04-26 LAB — COMPREHENSIVE METABOLIC PANEL
ALT: 18 U/L (ref 0–44)
AST: 22 U/L (ref 15–41)
Albumin: 3.6 g/dL (ref 3.5–5.0)
Alkaline Phosphatase: 58 U/L (ref 38–126)
Anion gap: 8 (ref 5–15)
BUN: 15 mg/dL (ref 8–23)
CO2: 21 mmol/L — ABNORMAL LOW (ref 22–32)
Calcium: 8.9 mg/dL (ref 8.9–10.3)
Chloride: 109 mmol/L (ref 98–111)
Creatinine, Ser: 1.38 mg/dL — ABNORMAL HIGH (ref 0.61–1.24)
GFR, Estimated: 57 mL/min — ABNORMAL LOW (ref >60)
Glucose, Bld: 120 mg/dL — ABNORMAL HIGH (ref 70–99)
Potassium: 4.5 mmol/L (ref 3.5–5.1)
Sodium: 138 mmol/L (ref 135–145)
Total Bilirubin: 0.6 mg/dL (ref 0.0–1.2)
Total Protein: 6.8 g/dL (ref 6.5–8.1)

## 2023-04-26 LAB — RESP PANEL BY RT-PCR (RSV, FLU A&B, COVID)  RVPGX2
Influenza A by PCR: NEGATIVE
Influenza B by PCR: NEGATIVE
Resp Syncytial Virus by PCR: NEGATIVE
SARS Coronavirus 2 by RT PCR: NEGATIVE

## 2023-04-26 LAB — TROPONIN I (HIGH SENSITIVITY)
Troponin I (High Sensitivity): 4 ng/L (ref <18)
Troponin I (High Sensitivity): 5 ng/L (ref <18)

## 2023-04-26 LAB — CBG MONITORING, ED: Glucose-Capillary: 87 mg/dL (ref 70–99)

## 2023-04-26 NOTE — ED Notes (Signed)
 Ginger ale given to patient.  Patient c/o being hungry, anting food.  Tolerating ginger ale well. Assisted patient to BR. Patient able to stand, ambulated and void independently. Placed back into recliner chair.  Phlebotomy called for a redraw. Wife sitting with patient in Triage area.

## 2023-04-26 NOTE — ED Triage Notes (Signed)
 Pt comes via EMs from Endoscopy. Pt O2 dropped after procedure. Pt vitals good with EMS and O2 at 98% RA.  Pt having continued cough and states he didn't have that prior.

## 2023-04-26 NOTE — ED Notes (Incomplete)
 Called la

## 2023-04-26 NOTE — ED Provider Triage Note (Signed)
 Emergency Medicine Provider Triage Evaluation Note  Carlos Contreras , a 68 y.o. male  was evaluated in triage.  Pt complains of cough and period of hypoxia following endoscopy earlier today.  Patient with no chest pain, abdominal pain.  Patient is having a dry nonstop cough in triage.  No hives.  Review of Systems  Positive: Cough, hypoxia following GI procedure. Negative: Chest pain, emesis, rash  Physical Exam  BP 98/67   Pulse 73   Temp 98 F (36.7 C)   Resp 19   SpO2 93%  Gen:   Awake, no distress   Resp:  Normal effort  MSK:   Moves extremities without difficulty  Other:    Medical Decision Making  Medically screening exam initiated at 4:35 PM.  Appropriate orders placed.  Carlos Contreras was informed that the remainder of the evaluation will be completed by another provider, this initial triage assessment does not replace that evaluation, and the importance of remaining in the ED until their evaluation is complete.  Patient arrives with cough and hypoxia reported after endoscopy.  Patient's O2 sats are currently 98% on room air.  He does have a ongoing cough.  No evidence of angioedema or hives.  At this time patient will have labs, EKG, chest x-ray, COVID and flu swab   Racheal Patches, PA-C 04/26/23 1635

## 2023-04-26 NOTE — ED Notes (Signed)
 20GA SL removed from left FA at pt request--cannula intact, dsng applied

## 2023-04-26 NOTE — ED Triage Notes (Signed)
 Arrives via [facility].  Had endoscopy today, sats dropped after procedure.  Per EMS VS wnl  Sats 98% on RA on EMS arrival 95/35.

## 2023-04-27 ENCOUNTER — Telehealth: Payer: Self-pay | Admitting: Emergency Medicine

## 2023-04-27 NOTE — Telephone Encounter (Signed)
 Called patient due to left emergency department before provider exam to inquire about condition and follow up plans. Says he is feeling better today. I asked him to call the dr who did the procedure and/or his pcp to review the cxr that was done in triage.  I told him that it was changed from the one last month and that his doctor needs to review it to see if anything else needs to be done.  He says he will call now to have it reviewed.

## 2023-06-07 ENCOUNTER — Encounter

## 2023-06-09 ENCOUNTER — Encounter: Admission: RE | Payer: Self-pay | Source: Home / Self Care

## 2023-06-09 ENCOUNTER — Ambulatory Visit: Admission: RE | Admit: 2023-06-09 | Source: Home / Self Care | Admitting: Internal Medicine

## 2023-06-09 SURGERY — COLONOSCOPY
Anesthesia: General
